# Patient Record
Sex: Female | Born: 1965 | Race: White | Hispanic: No | Marital: Married | Smoking: Never smoker
Health system: Southern US, Community
[De-identification: ages and names within clinical notes are randomized; demographics above are authoritative.]

## PROBLEM LIST (undated history)

## (undated) DIAGNOSIS — K760 Fatty (change of) liver, not elsewhere classified: Secondary | ICD-10-CM

## (undated) DIAGNOSIS — E162 Hypoglycemia, unspecified: Secondary | ICD-10-CM

## (undated) DIAGNOSIS — I10 Essential (primary) hypertension: Secondary | ICD-10-CM

## (undated) DIAGNOSIS — E119 Type 2 diabetes mellitus without complications: Secondary | ICD-10-CM

## (undated) DIAGNOSIS — Z9889 Other specified postprocedural states: Secondary | ICD-10-CM

## (undated) DIAGNOSIS — M722 Plantar fascial fibromatosis: Secondary | ICD-10-CM

## (undated) DIAGNOSIS — R112 Nausea with vomiting, unspecified: Secondary | ICD-10-CM

## (undated) DIAGNOSIS — E876 Hypokalemia: Secondary | ICD-10-CM

## (undated) DIAGNOSIS — F32A Depression, unspecified: Secondary | ICD-10-CM

## (undated) HISTORY — DX: Depression, unspecified: F32.A

## (undated) HISTORY — PX: ABDOMINAL HYSTERECTOMY: SHX81

## (undated) HISTORY — DX: Hypoglycemia, unspecified: E16.2

## (undated) HISTORY — DX: Hypokalemia: E87.6

## (undated) HISTORY — DX: Essential (primary) hypertension: I10

## (undated) HISTORY — PX: NO PAST SURGERIES: SHX2092

---

## 2010-04-05 HISTORY — PX: BREAST BIOPSY: SHX20

## 2011-07-14 LAB — CBC
HGB: 14.1 g/dL (ref 12.0–16.0)
MCH: 30.2 pg (ref 26.0–34.0)
MCV: 90 fL (ref 80–100)
Platelet: 343 10*3/uL (ref 150–440)
RBC: 4.65 10*6/uL (ref 3.80–5.20)
WBC: 10.7 10*3/uL (ref 3.6–11.0)

## 2011-07-14 LAB — CK TOTAL AND CKMB (NOT AT ARMC): CK, Total: 43 U/L (ref 21–215)

## 2011-07-14 LAB — HEPATIC FUNCTION PANEL A (ARMC)
Albumin: 4.2 g/dL (ref 3.4–5.0)
Bilirubin, Direct: <0.1 mg/dL (ref 0.00–0.20)
SGOT(AST): 56 U/L — ABNORMAL HIGH (ref 15–37)
Total Protein: 8.1 g/dL (ref 6.4–8.2)

## 2011-07-14 LAB — BASIC METABOLIC PANEL
Anion Gap: 13 (ref 7–16)
BUN: 13 mg/dL (ref 7–18)
Calcium, Total: 9.1 mg/dL (ref 8.5–10.1)
Chloride: 104 mmol/L (ref 98–107)
Creatinine: 0.65 mg/dL (ref 0.60–1.30)
Glucose: 106 mg/dL — ABNORMAL HIGH (ref 65–99)
Osmolality: 280 (ref 275–301)

## 2011-07-15 ENCOUNTER — Observation Stay: Payer: Self-pay | Admitting: Internal Medicine

## 2011-07-15 DIAGNOSIS — R079 Chest pain, unspecified: Secondary | ICD-10-CM

## 2011-07-15 LAB — POTASSIUM: Potassium: 3.2 mmol/L — ABNORMAL LOW (ref 3.5–5.1)

## 2011-07-15 LAB — HEPATIC FUNCTION PANEL A (ARMC)
Albumin: 3.9 g/dL (ref 3.4–5.0)
Alkaline Phosphatase: 79 U/L (ref 50–136)
Bilirubin, Direct: <0.1 mg/dL (ref 0.00–0.20)
Bilirubin,Total: 0.3 mg/dL (ref 0.2–1.0)
SGOT(AST): 51 U/L — ABNORMAL HIGH (ref 15–37)
SGPT (ALT): 90 U/L — ABNORMAL HIGH
Total Protein: 7.5 g/dL (ref 6.4–8.2)

## 2011-07-15 LAB — CK TOTAL AND CKMB (NOT AT ARMC): CK, Total: 32 U/L (ref 21–215)

## 2011-07-15 LAB — TROPONIN I
Troponin-I: <0.02 ng/mL
Troponin-I: <0.02 ng/mL

## 2011-07-21 ENCOUNTER — Encounter: Payer: Self-pay | Admitting: *Deleted

## 2011-07-23 ENCOUNTER — Encounter: Payer: Self-pay | Admitting: Cardiovascular Disease

## 2011-07-23 ENCOUNTER — Ambulatory Visit (INDEPENDENT_AMBULATORY_CARE_PROVIDER_SITE_OTHER): Payer: No Typology Code available for payment source | Admitting: Cardiovascular Disease

## 2011-07-23 DIAGNOSIS — R079 Chest pain, unspecified: Secondary | ICD-10-CM | POA: Insufficient documentation

## 2011-07-23 DIAGNOSIS — I10 Essential (primary) hypertension: Secondary | ICD-10-CM | POA: Insufficient documentation

## 2011-07-23 MED ORDER — LISINOPRIL 20 MG PO TABS: 20.0000 mg | ORAL_TABLET | Freq: Every day | ORAL | Status: DC

## 2011-07-23 NOTE — Patient Instructions (Signed)
Your physician recommends that you return for lab work in: TODAY BMET, LFT, MAGNESIUM, TSH  Your physician has requested that you have an echocardiogram DX CHEST PAIN 786.50. Echocardiography is a painless test that uses sound waves to create images of your heart. It provides your doctor with information about the size and shape of your heart and how well your heart's chambers and valves are working. This procedure takes approximately one hour. There are no restrictions for this procedure.   FOLLOW UP AS NEEDED

## 2011-07-23 NOTE — Assessment & Plan Note (Signed)
She was recently diagnosed with hypertension. She does have family history of hypertension. She was mildly hypokalemic on presentation. Her blood pressure has improved significantly after the addition of lisinopril 10 mg once daily but she still gets frequent feedings about 140. Thus, I will increase the dose to 20 mg once daily. Also given her recent tachycardia, hypertension and anxiety , I will check a TSH level. She was mildly hypokalemic of unclear etiology. I will repeat her CMP today. If hypokalemia persists, she might need to be evaluated for secondary hypertension. Her blood pressure seems to be much better controlled though on one medication now and the possibility of that is still not high. Patient needs to establish with a primary care physician. I suggested Dr. Dan Humphreys or Dr. Darrick Huntsman and provided her with contact information.

## 2011-07-23 NOTE — Assessment & Plan Note (Signed)
Her chest pain is atypical and does not seem to be cardiac.  It is likely musculoskeletal in nature. There seems to be a component of anxiety as well. These symptoms have improved since her hospital discharge. Due to her continued symptoms and recent diagnosis of hypertension, I will obtain an echocardiogram to evaluate LV systolic function and left ventricular hypertrophy.

## 2011-07-23 NOTE — Progress Notes (Signed)
HPI  This is a 46 year old female who is here today for a followup visit after recent hospitalization at [facility]. She presented there with atypical chest pain. She was found to be hypertensive on presentation with blood pressure 174/98. Her labs were remarkable for hypokalemia with a potassium of 3.3. Her liver enzymes were mildly elevated with an ALT of 96. She ruled out for myocardial infarction by cardiac enzymes. She was mildly tachycardic on presentation with a heart 109. She underwent a treadmill stress test which showed no evidence of ischemia but she did have hypertensive response to exercise. Her blood pressure actually went up to 230/148 with exercise. She was started on lisinopril 10 mg once daily. Overall, she has been feeling better. Her blood pressure has improved at home. However, she continues to have readings above 140 systolic. When her blood pressure is elevated, she complains of palpitations although the heart rate is not high at that time. She continues to have brief episodes of sharp chest discomfort lasting for a few seconds. This usually happens at rest and not with activities. She denies any shortness of breath even with intense physical activities. She seems to be anxious about her symptoms. She is not aware of any thyroid problems. She is a nonsmoker and drinks alcohol occasionally. She is a mother of 3 teenage boys and that creates stress. 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  No Known Allergies   Current Outpatient Prescriptions on File Prior to Visit  Medication Sig Dispense Refill  . omeprazole (PRILOSEC) 10 MG capsule Take 10 mg by mouth daily as needed.      Marland Kitchen DISCONTD: lisinopril (PRINIVIL,ZESTRIL) 10 MG tablet Take 10 mg by mouth daily.         Past Medical History  Diagnosis Date  . Chest pain   . Hypokalemia   . Hypoglycemia   . HTN (hypertension)      History reviewed. No pertinent past surgical history.   Family History    Problem Relation Age of Onset  . Heart disease Mother   . Coronary artery disease Mother 14  . Heart failure Mother     chf  . Diabetes Mother   . Heart disease Father     Defibrillator  . Diabetes Father   . Diabetes Sister      History   Social History  . Marital Status: Married    Spouse Name: N/A    Number of Children: 3  . Years of Education: N/A   Occupational History  . house wife   . 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 Smoking status: Never Smoker   . Smokeless tobacco: Never Used  . Alcohol Use: Yes     ocassionally  . Drug Use: No  . Sexually Active: Not on file   Other Topics Concern  . Not on file   Social History Narrative  . No narrative on file     ROS Constitutional: Negative for fever, chills, diaphoresis, activity change, appetite change and fatigue.  HENT: Negative for hearing loss, nosebleeds, congestion, sore throat, facial swelling, drooling, trouble swallowing, neck pain, voice change, sinus pressure and tinnitus.  Eyes: Negative for photophobia, pain, discharge and visual disturbance.  Respiratory: Negative for apnea, cough, shortness of breath and wheezing.  Cardiovascular: Negative for and leg swelling.  Gastrointestinal: Negative for nausea, vomiting, abdominal pain, diarrhea, constipation, blood in stool and abdominal distention.  Genitourinary: Negative for dysuria, urgency, frequency, hematuria and decreased urine volume.  Musculoskeletal: Negative for myalgias, back pain, joint swelling, arthralgias and gait problem.  Skin: Negative for color change, pallor, rash and wound.  Neurological: Negative for dizziness, tremors, seizures, syncope, speech difficulty, weakness, light-headedness, numbness and headaches.  Psychiatric/Behavioral: Negative for suicidal ideas, hallucinations, behavioral problems and agitation.    PHYSICAL EXAM   BP 134/84  Pulse 77  Ht 5\' 5"  (1.651 m)  Wt 189 lb 8 oz  (85.957 kg)  BMI 31.53 kg/m2 Constitutional: She is oriented to person, place, and time. She appears well-developed and well-nourished. No distress.  HENT: No nasal discharge.  Head: Normocephalic and atraumatic.  Eyes: Pupils are equal and round. Right eye exhibits no discharge. Left eye exhibits no discharge.  Neck: Normal range of motion. Neck supple. No JVD present. No thyromegaly present.  Cardiovascular: Normal rate, regular rhythm, normal heart sounds. Exam reveals no gallop and no friction rub. No murmur heard.  Pulmonary/Chest: Effort normal and breath sounds normal. No stridor. No respiratory distress. She has no wheezes. She has no rales. She exhibits no tenderness.  Abdominal: Soft. Bowel sounds are normal. She exhibits no distension. There is no tenderness. There is no rebound and no guarding.  Musculoskeletal: Normal range of motion. She exhibits no edema and no tenderness.  Neurological: She is alert and oriented to person, place, and time. Coordination normal.  Skin: Skin is warm and dry. No rash noted. She is not diaphoretic. No erythema. No pallor.  Psychiatric: She has a normal mood and affect. Her behavior is normal. Judgment and thought content normal.     EKG: normal sinus rhythm with no significant ST or T wave changes.    ASSESSMENT AND PLAN

## 2011-07-24 LAB — HEPATIC FUNCTION PANEL
ALT: 48 IU/L — ABNORMAL HIGH (ref 0–32)
AST: 26 IU/L (ref 0–40)
Alkaline Phosphatase: 83 IU/L (ref 25–150)
Bilirubin, Direct: 0.09 mg/dL (ref 0.00–0.40)
Total Protein: 7 g/dL (ref 6.0–8.5)

## 2011-07-24 LAB — BASIC METABOLIC PANEL
Chloride: 103 mmol/L (ref 97–108)
GFR calc Af Amer: 111 mL/min/{1.73_m2} (ref >59)
GFR calc non Af Amer: 96 mL/min/{1.73_m2} (ref >59)
Glucose: 92 mg/dL (ref 65–99)
Potassium: 4 mmol/L (ref 3.5–5.2)
Sodium: 139 mmol/L (ref 134–144)

## 2011-07-24 LAB — TSH: TSH: 1.98 u[IU]/mL (ref 0.450–4.500)

## 2011-07-24 LAB — MAGNESIUM: Magnesium: 2 mg/dL (ref 1.6–2.6)

## 2011-07-26 NOTE — Progress Notes (Signed)
Pt.notified

## 2011-07-28 ENCOUNTER — Other Ambulatory Visit (INDEPENDENT_AMBULATORY_CARE_PROVIDER_SITE_OTHER): Payer: No Typology Code available for payment source

## 2011-07-28 ENCOUNTER — Other Ambulatory Visit: Payer: Self-pay

## 2011-07-28 DIAGNOSIS — R079 Chest pain, unspecified: Secondary | ICD-10-CM

## 2011-07-28 DIAGNOSIS — I10 Essential (primary) hypertension: Secondary | ICD-10-CM

## 2011-07-28 DIAGNOSIS — R011 Cardiac murmur, unspecified: Secondary | ICD-10-CM

## 2011-08-31 ENCOUNTER — Other Ambulatory Visit: Payer: No Typology Code available for payment source

## 2011-10-05 ENCOUNTER — Ambulatory Visit: Payer: Self-pay

## 2011-11-01 ENCOUNTER — Ambulatory Visit: Payer: Self-pay

## 2012-12-01 ENCOUNTER — Ambulatory Visit: Payer: Self-pay

## 2013-07-29 IMAGING — CR DG CHEST 2V
1 series · 2 of 2 positions shown · non-contrast
Comparison: none

REASON FOR EXAM: chest pain
COMMENTS:   May transport without cardiac monitor

PROCEDURE:     DXR - DXR CHEST PA (OR AP) AND LATERAL  - July 14, 2011  [DATE]
RESULT:     The lung fields are clear. The heart, mediastinal and osseous
structures show no significant abnormalities.

[Series 1: w chest pa · 0.14mm/px · 2 of 2 slices shown]
[im 1/2]
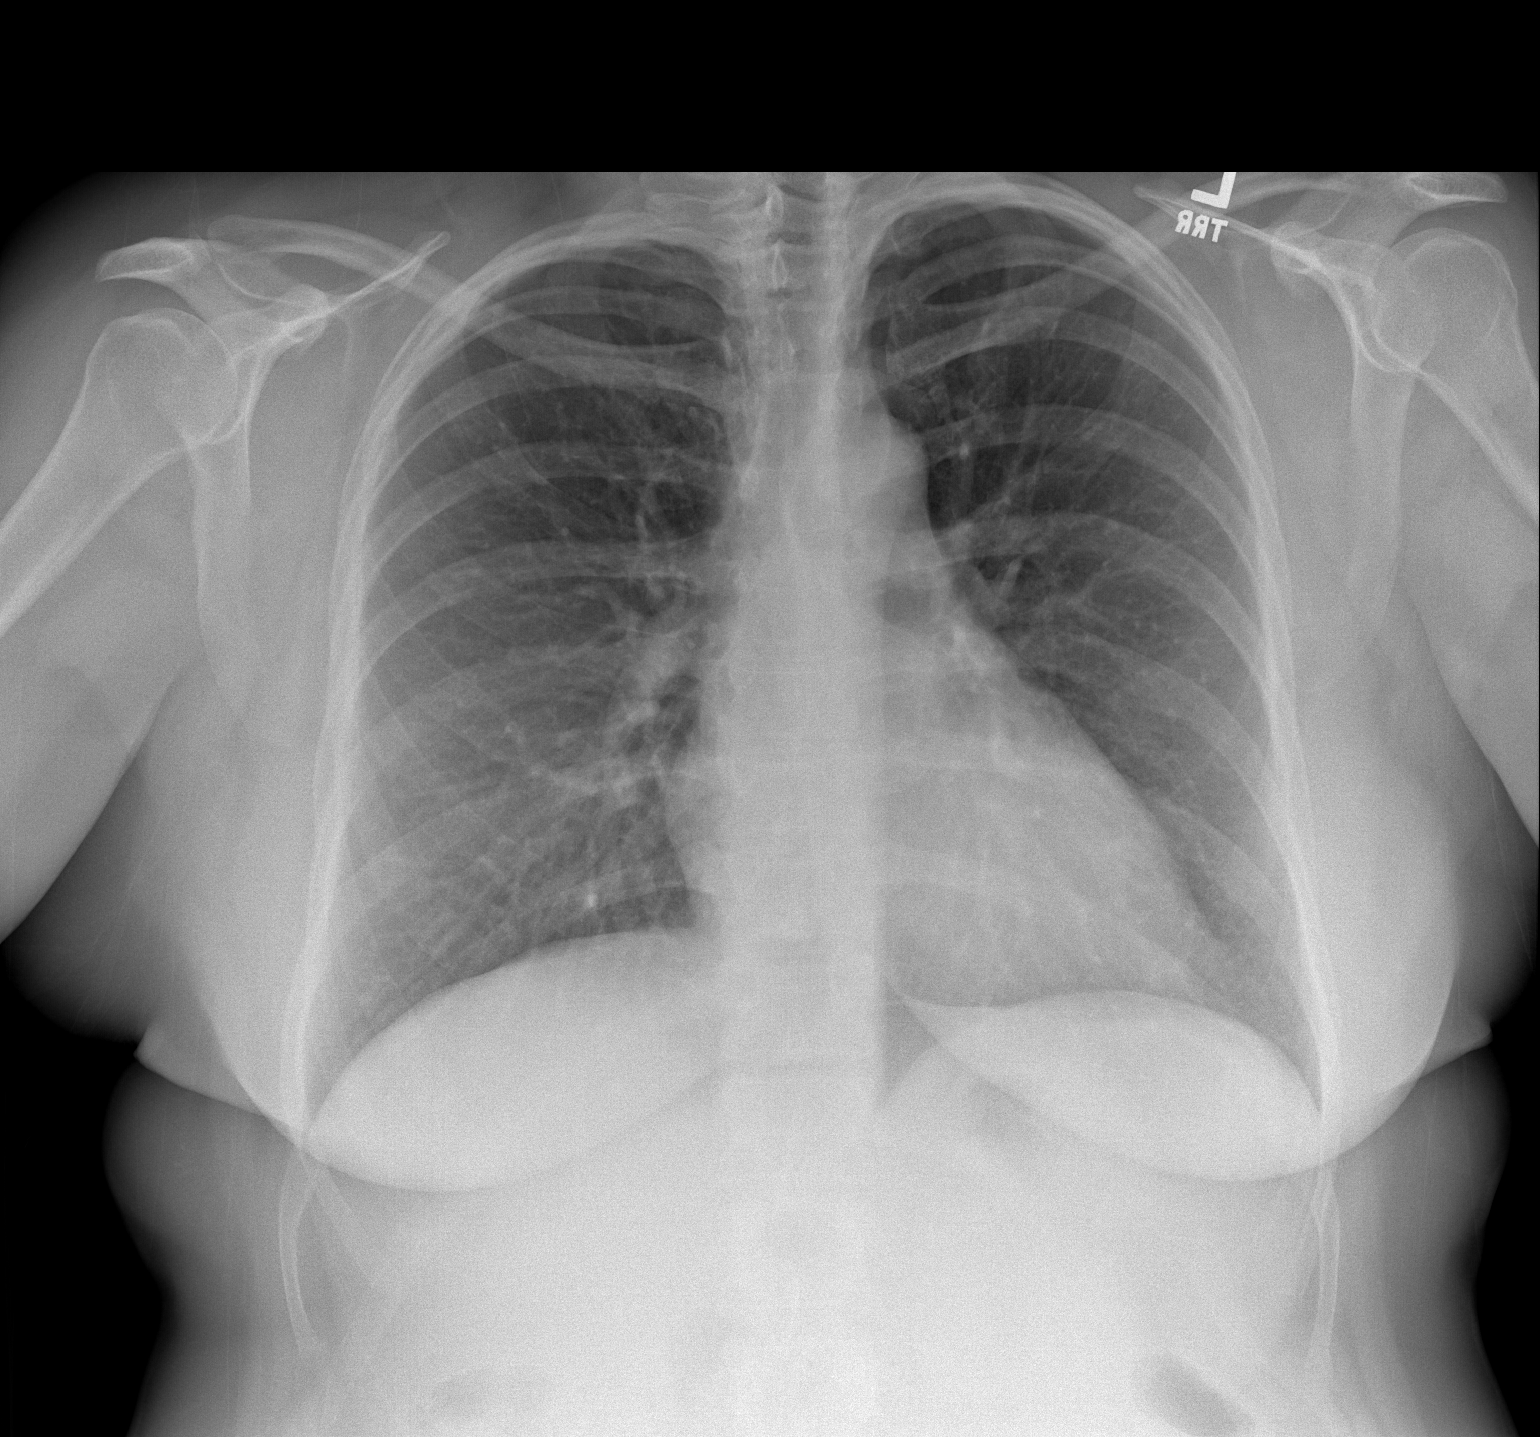
[im 2/2]
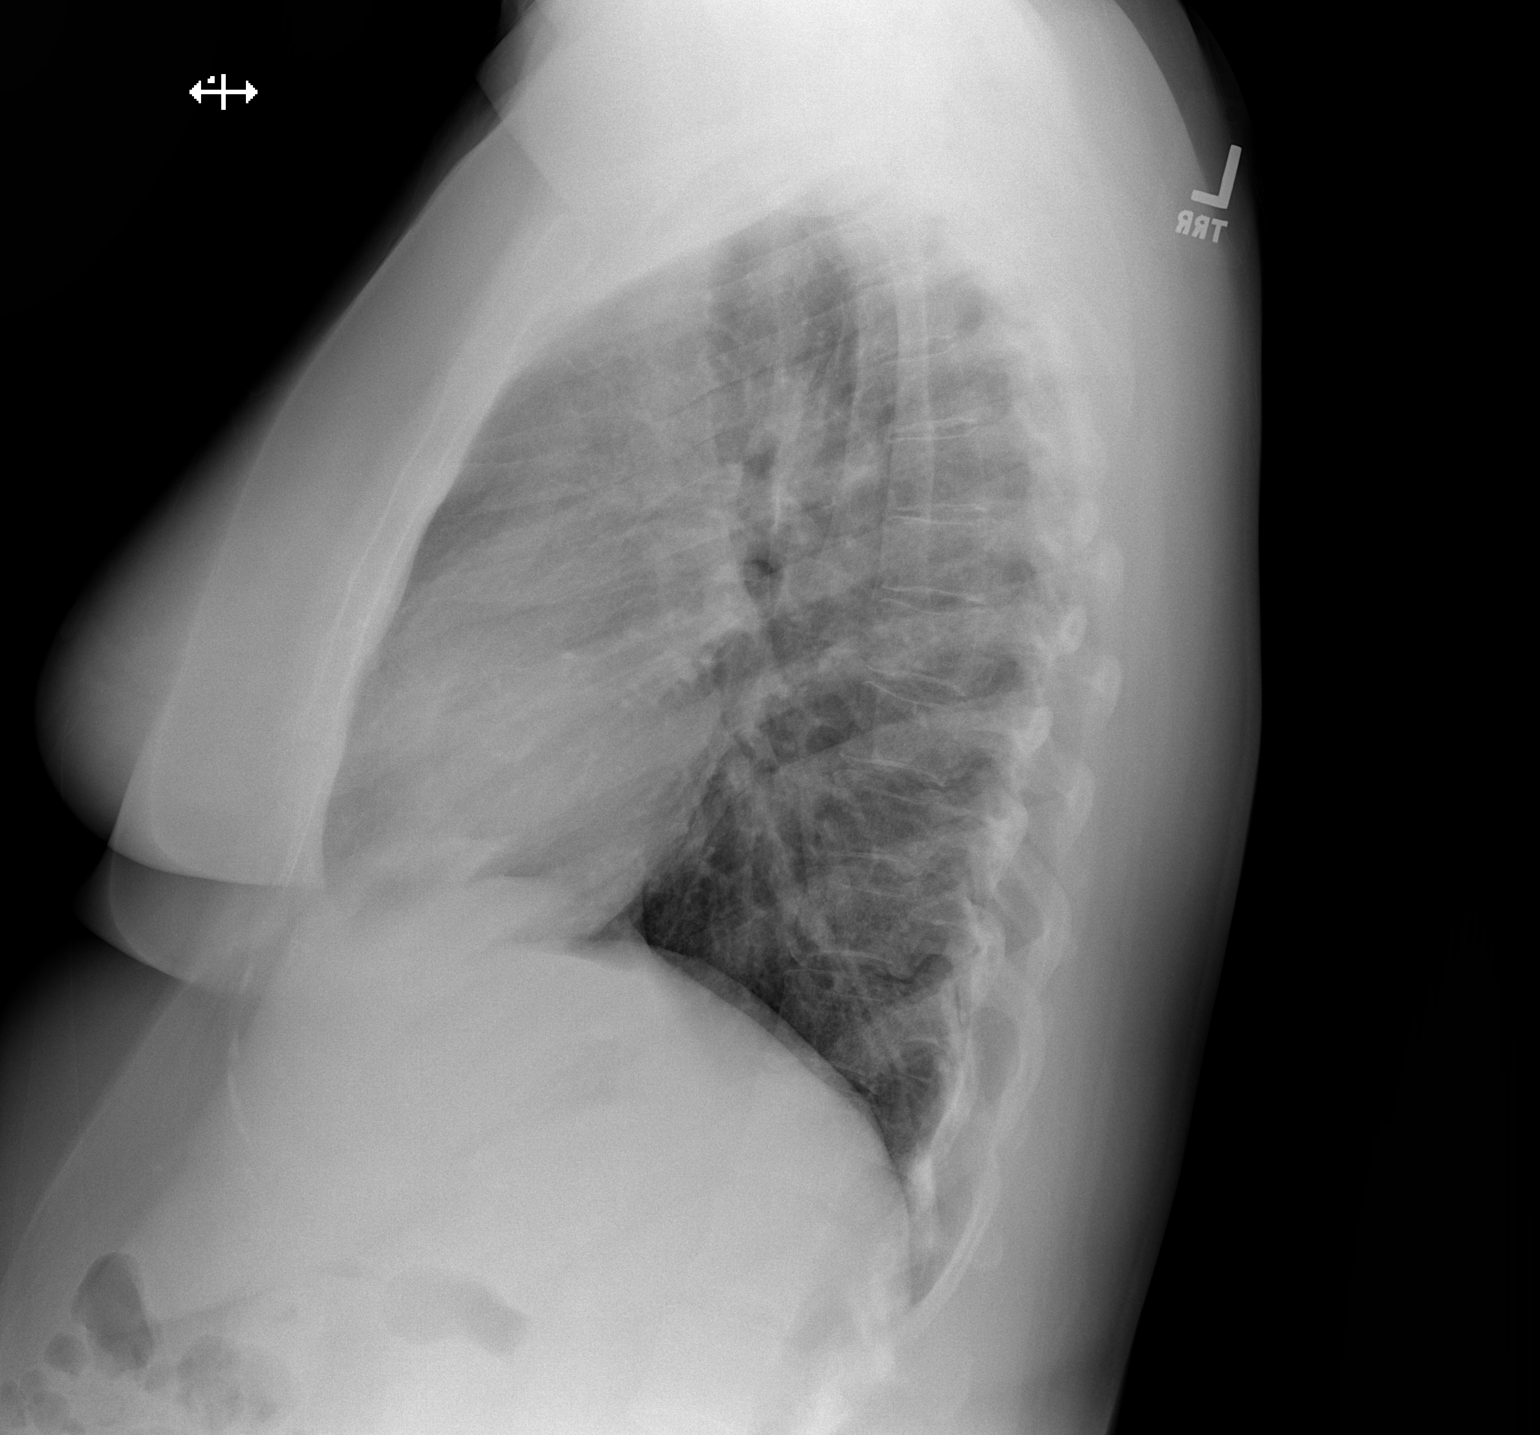

[2 of 2 positions shown; findings below may reference images not displayed]

IMPRESSION: 1.     No significant abnormalities are noted.

## 2013-12-03 ENCOUNTER — Ambulatory Visit: Payer: Self-pay

## 2014-07-28 NOTE — H&P (Signed)
PATIENT NAMEJEANET, [name] MR#:  [id number] DATE OF BIRTH:  Dec 05, 1965  DATE OF ADMISSION:  07/15/2011  PRIMARY CARE PHYSICIAN: None.  CHIEF COMPLAINT: Chest pain.   HISTORY OF PRESENT ILLNESS: [name] is a 49 year old female presented with left sided chest pain, dull in nature, severity is 5/10, radiating to left arm pit, duration is all day. No other associated symptoms like sweating or vomiting or shortness of breath.  PAST MEDICAL HISTORY: History of coronary artery disease.   PAST SURGICAL HISTORY: None.   ADMISSION MEDICATIONS: None.   ALLERGIES: No known drug allergies.  SOCIAL HABITS: Nonsmoker, never smoked. No history of alcohol or drug abuse.   FAMILY HISTORY: Both parents had heart disease, but at an old age, her father in his 61s and later in life he had a defibrillator. Her mother had coronary disease at the age of 32. She had congestive heart failure. She is also diabetic. Her father has diabetes mellitus and she has one sister who has diabetes as well.   SOCIAL HISTORY: The patient is married. She has three children. She lives with her husband. 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   REVIEW OF SYSTEMS: CONSTITUTIONAL: No fever. No chills. No night sweats. No fatigue. EYES: No blurring of vision. No double vision. ENT: No hearing impairment. No sore throat. No dysphagia. CARDIOVASCULAR: Reports left upper chest pain. No shortness of breath. No edema. No syncope. No palpitations. RESPIRATORY: No shortness of breath. No cough. No sputum production, but reports the chest pain as above. GASTROINTESTINAL: She has no abdominal pain, but reports some indigestion feeling. No vomiting. No diarrhea. GENITOURINARY: No dysuria. No frequency of urination. No vaginal bleed. MUSCULOSKELETAL: No joint swelling. No pain. No muscular pain or swelling. INTEGUMENTARY: No skin rash. No ulcers. NEUROLOGY: No focal weakness. No  seizure activity. No headache. PSYCHIATRY: No anxiety. No depression. ENDOCRINE: No heat or cold intolerance. No polyuria or polydipsia.   PHYSICAL EXAMINATION:   VITAL SIGNS: Blood pressure 174/98, pulse 80, respiratory rate 18, temperature 98.8, and oxygen saturation 96%.   GENERAL APPEARANCE: Young female lying in bed in no acute distress.   HEAD AND NECK EXAMINATION: No pallor. No icterus. No cyanosis.   EARS, NOSE, AND THROAT: Hearing was normal. Nasal mucosa, lips, and tongue were normal.   EYES: Normal eyelids and conjunctiva. Pupils are about 4 to 5 mm, equal and reactive to light.   NECK: Supple. Trachea at midline. No thyromegaly. No cervical lymphadenopathy. No masses.   HEART: Normal S1 and S2. No S3 or S4. No murmur. No gallop. No carotid bruits.   RESPIRATORY: Normal breathing pattern without use of accessory muscles. No rales. No wheezing.   ABDOMEN: Soft without tenderness. No hepatosplenomegaly. No masses. No hernias.   MUSCULOSKELETAL: No joint swelling. No clubbing.   SKIN: No ulcers. No subcutaneous nodules.   NEUROLOGIC: Cranial nerves II through XII are intact. No focal motor deficits.   PSYCHIATRY: The patient is alert and oriented x3. Mood and affect were normal.   LABS/STUDIES: Serum glucose 106, BUN 13, creatinine 0.65, sodium 140, and potassium was low at 3.3. Liver function tests were normal, but elevated transaminases;  AST 56 and ALT 96. CPK 43. Troponin less than 0.02. CBC showed white count 10,000, hemoglobin 14, hematocrit 41, and platelet count 343.   Her initial EKG showed sinus tachycardia at rate of 109 per minute.  Unremarkable EKG.  Followup EKG  showed sinus rhythm at rate of 70 per minute. No ischemic abnormalities.   ASSESSMENT:  1. Left-sided chest pain. 2. Hypertension. The patient is not known to be hypertensive before. 3. Mild hypokalemia.  4. Elevated liver transaminases.   PLAN: Admit the patient for observation. Follow-up on  cardiac enzymes. I placed her on metoprolol 25 mg twice a day. Aspirin 325 mg a day. Schedule her for treadmill stress test in the morning. I ordered viral hepatitis screen. I also ordered potassium supplementation to correct the hypokalemia.   TIME SPENT EVALUATING THIS PATIENT: More than 45 minutes.  ____________________________ Clovis Pu. Lenore Manner, MD amd:slb D: 07/15/2011 00:42:08 ET T: 07/15/2011 08:56:38 ET JOB#: [id number]  cc: Clovis Pu. Lenore Manner, MD, <Dictator> Mike Craze Irven Coe MD ELECTRONICALLY SIGNED 07/24/2011 0:31

## 2014-07-28 NOTE — Discharge Summary (Signed)
[name] NAMEDILCIA, Sandoval MR#:  [id number] DATE OF BIRTH:  1965/09/12  DATE OF ADMISSION:  07/15/2011 DATE OF DISCHARGE:  07/15/2011  ADMITTING DIAGNOSIS: Chest pain.   DISCHARGE DIAGNOSES:  1. Chest pain.  2. Hypertension.  3. Hypokalemia.  4. Hypoglycemia in nonfasting specimen.   5. Elevated transaminases of unclear etiology.   DISCHARGE CONDITION: Stable.   DISCHARGE MEDICATIONS: [name] is to start lisinopril 10 mg p.o. daily dose.   DIET: 2 gram salt.  ACTIVITY LIMITATIONS: As tolerated. Advance activity to 30 minute five times a week.   FOLLOW UP: Follow-up appointment with Dr. Clayborn Bigness to establish primary care physician and be seen in the next few days after discharge. Also Dr. Rockey Situ in one week after discharge.    CONSULTANTS: None.   HISTORY OF PRESENT ILLNESS: [name] is a 49 year old Caucasian female with no significant past medical history who presented to the hospital with complaints of chest pains. Please refer to Dr. Zacarias Pontes admission note on 07/15/2011. On arrival to Emergency Room [name]'s blood pressure was 174/98, pulse 80, respiration rate 18, temperature 98.8 and oxygen saturation was 96%. Physical examination was unremarkable.   LABORATORY, DIAGNOSTIC AND RADIOLOGICAL DATA: Chest PA and lateral 07/14/2011 showed no significant abnormalities found.   [name]'s EKG showed sinus tachycardia at 109 beats per minute, no acute ST-T changes, however, were noted. [name]'s lab data revealed low potassium of 3.3, glucose 106, otherwise BMP was unremarkable. [name]'s cardiac enzymes x2 were normal. [name]'s CBC was within normal limits. [name]'s chest x-ray was unremarkable as mentioned above.   HOSPITAL COURSE: She was admitted to the hospital for further evaluation. She was started on beta blockers, aspirin, nitroglycerin and underwent treadmill stress test in the morning on 07/15/2011. She did well during cardiac stress test procedure as read by Dr. Rockey Situ  and her EKG was normal. However, [name] was noted to have significantly elevated blood pressure at rest as well as severe hypertension with exertion. Her blood pressure was initially 158/110 and increased even higher to 231/148 on exertion. She had no symptoms and her EKG was within normal limits. It was felt by Dr. Rockey Situ that [name]'s stress test is of low probability for coronary artery disease, however, he was concerned about [name]'s blood pressure and recommended to start [name] on blood pressure medications. As was discussed with [name], [name] will be started on lisinopril at 10 mg p.o. daily dose. On day of discharge [name]'s vital signs are stable. Temperature 97.7, pulse 82, respiration rate 20, blood pressure 138/64, saturation 95% on room air at rest. It is recommended to follow [name]'s blood pressure readings very closely and advance [name]'s blood pressure medications as soon as needed. [name] was also advised to change her lifestyle. She was advised to cut down on salt intake as well as try to exercise and she was in agreement with that.   In regards to hyperglycemia, which was noted on her basic metabolic panel, [name]'s glucose level was found to be mildly elevated at 106. [name] was advised to follow up with her primary care physician and get fasting blood glucose level checked or formal glucose tolerance test if needed.   [name] was noted also to be hypokalemic. Potassium level 3.3 on admission, 07/14/2011. Potassium level 3.2 on 07/15/2011. Low potassium level was of unclear significance or etiology, however, [name] was advised to have her blood potassium level rechecked as outpatient. Now since she has been initiated on lisinopril potassium level probably will normalize.   [name] was  noted to have elevated transaminases on arrival to the hospital. [name]'s AST as well as ALT were elevated to 56 and 96 respectively. Repeated lab studies showed slightly elevated AST as well  as ALT in approximately same range, 50s and 90s respectively. [name] was noted to have stable abnormal transaminases elevated of unclear etiology. She was advised to follow up with primary care physician and make decisions about evaluation investigation of her transaminase elevation. She was asymptomatic. Her abdomen was soft and bowel sounds are present. No pain was elicited on exam.   [name] is being discharged in stable condition with above-mentioned medications and follow up.    TIME SPENT: 40 minutes.   ____________________________ Theodoro Grist, MD rv:cms D: 07/15/2011 18:20:55 ET T: 07/18/2011 14:32:59 ET JOB#: [id number]  cc: Theodoro Grist, MD, <Dictator> Lavera Guise, MD [place] MD ELECTRONICALLY SIGNED 07/27/2011 14:70

## 2017-07-18 ENCOUNTER — Other Ambulatory Visit: Payer: Self-pay

## 2017-07-18 ENCOUNTER — Ambulatory Visit: Payer: No Typology Code available for payment source | Admitting: Nurse Practitioner

## 2017-07-18 ENCOUNTER — Encounter: Payer: Self-pay | Admitting: Nurse Practitioner

## 2017-07-18 VITALS — BP 168/99 | HR 74 | Resp 16 | Ht 65.0 in | Wt 204.0 lb

## 2017-07-18 DIAGNOSIS — I10 Essential (primary) hypertension: Secondary | ICD-10-CM | POA: Diagnosis not present

## 2017-07-18 DIAGNOSIS — R635 Abnormal weight gain: Secondary | ICD-10-CM

## 2017-07-18 DIAGNOSIS — R300 Dysuria: Secondary | ICD-10-CM | POA: Diagnosis not present

## 2017-07-18 DIAGNOSIS — F320 Major depressive disorder, single episode, mild: Secondary | ICD-10-CM | POA: Diagnosis not present

## 2017-07-18 LAB — POCT URINALYSIS DIPSTICK
Bilirubin, UA: NEGATIVE
Glucose, UA: NEGATIVE
KETONES UA: NEGATIVE
LEUKOCYTES UA: NEGATIVE
NITRITE UA: NEGATIVE
PH UA: 7.5 (ref 5.0–8.0)
Spec Grav, UA: 1.01 (ref 1.010–1.025)
UROBILINOGEN UA: 0.2 U/dL

## 2017-07-18 MED ORDER — ESCITALOPRAM OXALATE 5 MG PO TABS: 5.0000 mg | ORAL_TABLET | Freq: Every day | ORAL | 2 refills | Status: DC

## 2017-07-18 MED ORDER — LISINOPRIL 20 MG PO TABS: 20.0000 mg | ORAL_TABLET | Freq: Every day | ORAL | 2 refills | Status: DC

## 2017-07-18 NOTE — Progress Notes (Signed)
[facility] [place], [place] [address]  Internal MEDICINE  Office Visit Note  Patient Name: [name]  [id number]  [id number]  Date of Service: 08/07/2017     Pt is here for routine follow up.   Chief Complaint  Patient presents with  . Hypertension  . Allergic Rhinitis     The patient is here for follow up visit. Has not been here in few years. Is off blood pressure medication. bp is moderately elevated today.  She is due for routine blood work and wellness visit.  Recent treatment for UTI. Finished BID cipro last week on Thursday.  Hypertension  This is a chronic problem. The current episode started more than 1 year ago. The problem has been waxing and waning since onset. The problem is uncontrolled. Pertinent negatives include no chest pain, headaches, neck pain, palpitations or shortness of breath. There are no associated agents to hypertension. Risk factors for coronary artery disease include dyslipidemia and obesity. Past treatments include ACE inhibitors. The current treatment provides moderate improvement. There are no compliance problems.       Current Medication: Outpatient Encounter Medications as of 07/18/2017  Medication Sig  . escitalopram (LEXAPRO) 5 MG tablet Take 1 tablet (5 mg total) by mouth daily. Take 1 to 2 tablets po QPM (Patient taking differently: Take 1 to 2 tablets po QPM prn)  . lisinopril (PRINIVIL,ZESTRIL) 20 MG tablet Take 1 tablet (20 mg total) by mouth daily.  Marland Kitchen omeprazole (PRILOSEC) 10 MG capsule Take 10 mg by mouth daily as needed.  . [DISCONTINUED] lisinopril (PRINIVIL,ZESTRIL) 20 MG tablet Take 1 tablet (20 mg total) by mouth daily. (Patient not taking: Reported on 07/18/2017)   No facility-administered encounter medications on file as of 07/18/2017.     Surgical History: Past Surgical History:  Procedure Laterality Date  . NO PAST SURGERIES      Medical History: Past Medical History:  Diagnosis Date  . Chest  pain   . HTN (hypertension)   . Hypoglycemia   . Hypokalemia     Family History: Family History  Problem Relation Age of Onset  . Heart disease Mother   . Coronary artery disease Mother 77  . Heart failure Mother        chf  . Diabetes Mother   . Heart disease Father        Defibrillator  . Diabetes Father   . Diabetes Sister     Social History   Socioeconomic History  . Marital status: Married    Spouse name: Not on file  . Number of children: 3  . Years of education: Not on file  . Highest education level: Not on file  Occupational History  . 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 Financial resource strain: Not on file  . Food insecurity:    Worry: Not on file    Inability: Not on file  . Transportation needs:    Medical: Not on file    Non-medical: Not on file  Tobacco Use  . Smoking status: Never Smoker  . Smokeless tobacco: Never Used  Substance and Sexual Activity  . Alcohol use: Yes    Comment: ocassionally  . Drug use: No  . Sexual activity: Not on file  Lifestyle  . Physical activity:    Days per week: Not on file    Minutes per session: Not on file  .  Stress: Not on file  Relationships  . Social connections:    Talks on phone: Not on file    Gets together: Not on file    Attends religious service: Not on file    Active member of club or organization: Not on file    Attends meetings of clubs or organizations: Not on file    Relationship status: Not on file  . Intimate partner violence:    Fear of current or ex partner: Not on file    Emotionally abused: Not on file    Physically abused: Not on file    Forced sexual activity: Not on file  Other Topics Concern  . Not on file  Social History Narrative  . Not on file      Review of Systems  Constitutional: Positive for unexpected weight change. Negative for activity change, chills and fatigue.  HENT: Negative for  congestion, postnasal drip, rhinorrhea, sneezing, sore throat and voice change.   Eyes: Negative.  Negative for redness.  Respiratory: Negative for cough, chest tightness, shortness of breath and wheezing.   Cardiovascular: Negative for chest pain and palpitations.       Blood pressure elevated today.   Gastrointestinal: Negative for abdominal pain, constipation, diarrhea, nausea and vomiting.  Genitourinary: Negative for dysuria and frequency.  Musculoskeletal: Negative for arthralgias, back pain, joint swelling and neck pain.  Skin: Negative for rash.  Allergic/Immunologic: Positive for environmental allergies.  Neurological: Negative for dizziness, tremors, numbness and headaches.  Hematological: Negative for adenopathy. Does not bruise/bleed easily.  Psychiatric/Behavioral: Negative for behavioral problems (Depression), sleep disturbance and suicidal ideas. The patient is not nervous/anxious.     Today's Vitals   07/18/17 1210  BP: (!) 168/99  Pulse: 74  Resp: 16  SpO2: 94%  Weight: 204 lb (92.5 kg)  Height: 5\' 5"  (1.651 m)    Physical Exam  Constitutional: She is oriented to person, place, and time. She appears well-developed and well-nourished. No distress.  HENT:  Head: Normocephalic and atraumatic.  Nose: Nose normal.  Mouth/Throat: Oropharynx is clear and moist. No oropharyngeal exudate.  Eyes: Pupils are equal, round, and reactive to light. Conjunctivae and EOM are normal.  Neck: Normal range of motion. Neck supple. No JVD present. No tracheal deviation present. No thyromegaly present.  Cardiovascular: Normal rate, regular rhythm and normal heart sounds. Exam reveals no gallop and no friction rub.  No murmur heard. Pulmonary/Chest: Effort normal and breath sounds normal. No respiratory distress. She has no wheezes. She has no rales. She exhibits no tenderness.  Abdominal: Soft. Bowel sounds are normal. There is no tenderness.  Musculoskeletal: Normal range of motion.   Lymphadenopathy:    She has no cervical adenopathy.  Neurological: She is alert and oriented to person, place, and time. No cranial nerve deficit.  Skin: Skin is warm and dry. She is not diaphoretic.  Psychiatric: She has a normal mood and affect. Her behavior is normal. Judgment and thought content normal.  Nursing note and vitals reviewed.   Assessment/Plan: 1. Essential hypertension Restart lisinopril 20mg  daily. Check routine, fasting labs.  - lisinopril (PRINIVIL,ZESTRIL) 20 MG tablet; Take 1 tablet (20 mg total) by mouth daily.  Dispense: 30 tablet; Refill: 2  2. Depression, major, single episode, mild (HCC) Start lexapro 5mg  daily.  - escitalopram (LEXAPRO) 5 MG tablet; Take 1 tablet (5 mg total) by mouth daily. Take 1 to 2 tablets po QPM (Patient taking differently: Take 1 to 2 tablets po QPM prn)  Dispense: 45  tablet; Refill: 2  3. Abnormal weight gain Check routine, fasting labs. Will consider starting on appetite suppressant at next visit if blood pressure improves.   4. Dysuria - POCT urinalysis dipstick showing only trace protein. Will monitor.   General Counseling: Selinda verbalizes understanding of the findings of todays visit and agrees with plan of treatment. I have discussed any further diagnostic evaluation that may be needed or ordered today. We also reviewed her medications today. she has been encouraged to call the office with any questions or concerns that should arise related to todays visit.   Hypertension Counseling:   The following hypertensive lifestyle modification were recommended and discussed:  1. Limiting alcohol intake to less than 1 oz/day of ethanol:(24 oz of beer or 8 oz of wine or 2 oz of 100-proof whiskey). 2. Take baby ASA 81 mg daily. 3. Importance of regular aerobic exercise and losing weight. 4. Reduce dietary saturated fat and cholesterol intake for overall cardiovascular health. 5. Maintaining adequate dietary potassium, calcium, and  magnesium intake. 6. Regular monitoring of the blood pressure. 7. Reduce sodium intake to less than 100 mmol/day (less than 2.3 gm of sodium or less than 6 gm of sodium choride)   This patient was seen by Leretha Pol, FNP- C in Collaboration with Dr Lavera Guise as a part of collaborative care agreement    Orders Placed This Encounter  Procedures  . POCT urinalysis dipstick    Meds ordered this encounter  Medications  . lisinopril (PRINIVIL,ZESTRIL) 20 MG tablet    Sig: Take 1 tablet (20 mg total) by mouth daily.    Dispense:  30 tablet    Refill:  2    Order Specific Question:   Supervising Provider    Answer:   Lavera Guise [[id number]]  . escitalopram (LEXAPRO) 5 MG tablet    Sig: Take 1 tablet (5 mg total) by mouth daily. Take 1 to 2 tablets po QPM    Dispense:  45 tablet    Refill:  2    Order Specific Question:   Supervising Provider    Answer:   Lavera Guise [[id number]]    Time spent: 79 Minutes      Dr Lavera Guise Internal medicine

## 2017-07-20 ENCOUNTER — Other Ambulatory Visit: Payer: Self-pay | Admitting: Nurse Practitioner

## 2017-07-21 LAB — CBC
HEMATOCRIT: 40.5 % (ref 34.0–46.6)
HEMOGLOBIN: 13.6 g/dL (ref 11.1–15.9)
MCH: 30.6 pg (ref 26.6–33.0)
MCHC: 33.6 g/dL (ref 31.5–35.7)
MCV: 91 fL (ref 79–97)
Platelets: 173 10*3/uL (ref 150–379)
RBC: 4.44 x10E6/uL (ref 3.77–5.28)
RDW: 13.5 % (ref 12.3–15.4)
WBC: 5.6 10*3/uL (ref 3.4–10.8)

## 2017-07-21 LAB — T4, FREE: FREE T4: 1.13 ng/dL (ref 0.82–1.77)

## 2017-07-21 LAB — LIPID PANEL W/O CHOL/HDL RATIO
Cholesterol, Total: 188 mg/dL (ref 100–199)
HDL: 42 mg/dL (ref >39)
LDL CALC: 115 mg/dL — AB (ref 0–99)
Triglycerides: 155 mg/dL — ABNORMAL HIGH (ref 0–149)
VLDL CHOLESTEROL CAL: 31 mg/dL (ref 5–40)

## 2017-07-21 LAB — COMPREHENSIVE METABOLIC PANEL
A/G RATIO: 1.5 (ref 1.2–2.2)
ALT: 72 IU/L — ABNORMAL HIGH (ref 0–32)
AST: 43 IU/L — ABNORMAL HIGH (ref 0–40)
Albumin: 4 g/dL (ref 3.5–5.5)
Alkaline Phosphatase: 82 IU/L (ref 39–117)
BUN/Creatinine Ratio: 13 (ref 9–23)
BUN: 9 mg/dL (ref 6–24)
Bilirubin Total: 0.3 mg/dL (ref 0.0–1.2)
CALCIUM: 8.4 mg/dL — AB (ref 8.7–10.2)
CO2: 22 mmol/L (ref 20–29)
CREATININE: 0.69 mg/dL (ref 0.57–1.00)
Chloride: 103 mmol/L (ref 96–106)
GFR, EST AFRICAN AMERICAN: 116 mL/min/{1.73_m2} (ref >59)
GFR, EST NON AFRICAN AMERICAN: 100 mL/min/{1.73_m2} (ref >59)
Globulin, Total: 2.7 g/dL (ref 1.5–4.5)
Glucose: 100 mg/dL — ABNORMAL HIGH (ref 65–99)
POTASSIUM: 3.7 mmol/L (ref 3.5–5.2)
Sodium: 142 mmol/L (ref 134–144)
TOTAL PROTEIN: 6.7 g/dL (ref 6.0–8.5)

## 2017-07-21 LAB — TSH: TSH: 2.12 u[IU]/mL (ref 0.450–4.500)

## 2017-07-21 LAB — HGB A1C W/O EAG: HEMOGLOBIN A1C: 5.7 % — AB (ref 4.8–5.6)

## 2017-07-25 NOTE — Progress Notes (Signed)
Please let the patient know that her liver functions were a bit elevated. I would like to send her for additional lab work and get her set up for abdominal ultrasound for further evaluation. Lab slip is on my desk which may be mailed. Thanks

## 2017-07-26 ENCOUNTER — Telehealth: Payer: Self-pay

## 2017-07-26 NOTE — Telephone Encounter (Signed)
Mailed labslip  For additional lab

## 2017-07-26 NOTE — Telephone Encounter (Signed)
Pt advised for labs showed liver enzymes elevated we lik=e to additional labs and also U/s for abdominal as per pt she want hold for U/S for now she like to repeat lab and also mailed her labslip

## 2017-07-26 NOTE — Telephone Encounter (Signed)
-----   Message from Ronnell Freshwater, NP sent at 07/25/2017  8:48 AM EDT ----- Please let the patient know that her liver functions were a bit elevated. I would like to send her for additional lab work and get her set up for abdominal ultrasound for further evaluation. Lab slip is on my desk which may be mailed. Thanks

## 2017-08-01 ENCOUNTER — Other Ambulatory Visit: Payer: Self-pay | Admitting: Nurse Practitioner

## 2017-08-02 LAB — HEPATITIS PANEL, ACUTE
Hep A IgM: NEGATIVE
Hep B C IgM: NEGATIVE
Hep C Virus Ab: <0.1 s/co ratio (ref 0.0–0.9)
Hepatitis B Surface Ag: NEGATIVE

## 2017-08-02 LAB — HEPATIC FUNCTION PANEL
ALK PHOS: 83 IU/L (ref 39–117)
ALT: 59 IU/L — ABNORMAL HIGH (ref 0–32)
AST: 34 IU/L (ref 0–40)
Albumin: 3.8 g/dL (ref 3.5–5.5)
BILIRUBIN TOTAL: 0.3 mg/dL (ref 0.0–1.2)
BILIRUBIN, DIRECT: 0.09 mg/dL (ref 0.00–0.40)
Total Protein: 6.3 g/dL (ref 6.0–8.5)

## 2017-08-07 DIAGNOSIS — R635 Abnormal weight gain: Secondary | ICD-10-CM | POA: Insufficient documentation

## 2017-08-07 DIAGNOSIS — F320 Major depressive disorder, single episode, mild: Secondary | ICD-10-CM | POA: Insufficient documentation

## 2017-08-07 DIAGNOSIS — R300 Dysuria: Secondary | ICD-10-CM | POA: Insufficient documentation

## 2017-09-02 ENCOUNTER — Encounter: Payer: Self-pay | Admitting: Nurse Practitioner

## 2017-09-02 ENCOUNTER — Ambulatory Visit: Payer: No Typology Code available for payment source | Admitting: Nurse Practitioner

## 2017-09-02 VITALS — BP 129/80 | HR 84 | Resp 16 | Ht 65.0 in | Wt 198.0 lb

## 2017-09-02 DIAGNOSIS — F320 Major depressive disorder, single episode, mild: Secondary | ICD-10-CM | POA: Diagnosis not present

## 2017-09-02 DIAGNOSIS — I10 Essential (primary) hypertension: Secondary | ICD-10-CM

## 2017-09-02 DIAGNOSIS — Z0001 Encounter for general adult medical examination with abnormal findings: Secondary | ICD-10-CM

## 2017-09-02 DIAGNOSIS — R300 Dysuria: Secondary | ICD-10-CM

## 2017-09-02 DIAGNOSIS — Z124 Encounter for screening for malignant neoplasm of cervix: Secondary | ICD-10-CM | POA: Diagnosis not present

## 2017-09-02 NOTE — Progress Notes (Signed)
[facility] [place], [place] [address]  Internal MEDICINE  Office Visit Note  Patient Name: [name]  [id number]  [id number]  Date of Service: 09/21/2017   Pt is here for routine health maintenance examination  Chief Complaint  Patient presents with  . Hypertension  . Depression     Hypertension  This is a chronic problem. The current episode started more than 1 year ago. The problem is unchanged. The problem is controlled. Associated symptoms include anxiety and palpitations. Pertinent negatives include no chest pain, headaches, neck pain or shortness of breath. There are no associated agents to hypertension. Risk factors for coronary artery disease include dyslipidemia and post-menopausal state. Past treatments include ACE inhibitors. The current treatment provides moderate improvement. There are no compliance problems.   Depression         This is a recurrent problem.  The current episode started more than 1 month ago.   The onset quality is gradual.   The problem occurs intermittently.  The problem has been gradually improving since onset.  Associated symptoms include no fatigue, no headaches and no suicidal ideas.     The symptoms are aggravated by family issues.  Past treatments include SSRIs - Selective serotonin reuptake inhibitors.  Compliance with treatment is good.  Previous treatment provided moderate relief.  Risk factors include family history.   Past medical history includes anxiety.      Current Medication: Outpatient Encounter Medications as of 09/02/2017  Medication Sig  . lisinopril (PRINIVIL,ZESTRIL) 20 MG tablet Take 1 tablet (20 mg total) by mouth daily.  Marland Kitchen omeprazole (PRILOSEC) 10 MG capsule Take 10 mg by mouth daily as needed.  . [DISCONTINUED] escitalopram (LEXAPRO) 5 MG tablet Take 1 tablet (5 mg total) by mouth daily. Take 1 to 2 tablets po QPM (Patient taking differently: Take 1 to 2 tablets po QPM prn)   No  facility-administered encounter medications on file as of 09/02/2017.     Surgical History: Past Surgical History:  Procedure Laterality Date  . NO PAST SURGERIES      Medical History: Past Medical History:  Diagnosis Date  . Chest pain   . HTN (hypertension)   . Hypoglycemia   . Hypokalemia     Family History: Family History  Problem Relation Age of Onset  . Heart disease Mother   . Coronary artery disease Mother 21  . Heart failure Mother        chf  . Diabetes Mother   . Heart disease Father        Defibrillator  . Diabetes Father   . Diabetes Sister       Review of Systems  Constitutional: Negative for activity change, chills, fatigue and unexpected weight change.  HENT: Negative for congestion, postnasal drip, rhinorrhea, sneezing, sore throat and voice change.   Eyes: Negative.  Negative for redness.  Respiratory: Negative for cough, chest tightness, shortness of breath and wheezing.   Cardiovascular: Positive for palpitations. Negative for chest pain.       Blood pressure improved from last visit.   Gastrointestinal: Negative for abdominal pain, constipation, diarrhea, nausea and vomiting.  Endocrine: Negative for cold intolerance, heat intolerance, polydipsia, polyphagia and polyuria.  Genitourinary: Negative for dysuria and frequency.  Musculoskeletal: Negative for arthralgias, back pain, joint swelling and neck pain.  Skin: Negative for rash.  Allergic/Immunologic: Positive for environmental allergies.  Neurological: Negative for dizziness, tremors, numbness and headaches.  Hematological: Negative for adenopathy. Does not bruise/bleed easily.  Psychiatric/Behavioral: Positive for depression. Negative for behavioral problems (Depression), sleep disturbance and suicidal ideas. The patient is not nervous/anxious.     Today's Vitals   09/02/17 1414  BP: 129/80  Pulse: 84  Resp: 16  SpO2: 94%  Weight: 198 lb (89.8 kg)  Height: 5\' 5"  (1.651 m)     Physical Exam  Constitutional: She is oriented to person, place, and time. She appears well-developed and well-nourished. No distress.  HENT:  Head: Normocephalic and atraumatic.  Nose: Nose normal.  Mouth/Throat: Oropharynx is clear and moist. No oropharyngeal exudate.  Eyes: Pupils are equal, round, and reactive to light. Conjunctivae and EOM are normal.  Neck: Normal range of motion. Neck supple. No JVD present. No tracheal deviation present. No thyromegaly present.  Cardiovascular: Normal rate, regular rhythm, normal heart sounds and intact distal pulses. Exam reveals no gallop and no friction rub.  No murmur heard. Pulmonary/Chest: Effort normal and breath sounds normal. No respiratory distress. She has no wheezes. She has no rales. She exhibits no tenderness. Right breast exhibits no inverted nipple, no mass, no nipple discharge, no skin change and no tenderness. Left breast exhibits no inverted nipple, no mass, no nipple discharge, no skin change and no tenderness.  Abdominal: Soft. Bowel sounds are normal. There is no tenderness.  Genitourinary: Vagina normal and uterus normal.  Genitourinary Comments: No tenderness, masses, or organomegaly present during bimanual exam.   Musculoskeletal: Normal range of motion.  Lymphadenopathy:    She has no cervical adenopathy.  Neurological: She is alert and oriented to person, place, and time. No cranial nerve deficit.  Skin: Skin is warm and dry. She is not diaphoretic.  Psychiatric: She has a normal mood and affect. Her behavior is normal. Judgment and thought content normal.  Nursing note and vitals reviewed.    LABS: Recent Results (from the past 2160 hour(s))  POCT urinalysis dipstick     Status: None   Collection Time: 07/18/17 12:27 PM  Result Value Ref Range   Color, UA     Clarity, UA     Glucose, UA negative    Bilirubin, UA negative    Ketones, UA negative    Spec Grav, UA 1.010 1.010 - 1.025   Blood, UA trace    pH,  UA 7.5 5.0 - 8.0   Protein, UA trace    Urobilinogen, UA 0.2 0.2 or 1.0 E.U./dL   Nitrite, UA negative    Leukocytes, UA Negative Negative   Appearance     Odor    Comprehensive metabolic panel     Status: Abnormal   Collection Time: 07/20/17  9:42 AM  Result Value Ref Range   Glucose 100 (H) 65 - 99 mg/dL   BUN 9 6 - 24 mg/dL   Creatinine, Ser 0.69 0.57 - 1.00 mg/dL   GFR calc non Af Amer 100 >59 mL/min/1.73   GFR calc Af Amer 116 >59 mL/min/1.73   BUN/Creatinine Ratio 13 9 - 23   Sodium 142 134 - 144 mmol/L   Potassium 3.7 3.5 - 5.2 mmol/L   Chloride 103 96 - 106 mmol/L   CO2 22 20 - 29 mmol/L   Calcium 8.4 (L) 8.7 - 10.2 mg/dL   Total Protein 6.7 6.0 - 8.5 g/dL   Albumin 4.0 3.5 - 5.5 g/dL   Globulin, Total 2.7 1.5 - 4.5 g/dL   Albumin/Globulin Ratio 1.5 1.2 - 2.2   Bilirubin Total 0.3 0.0 - 1.2 mg/dL   Alkaline Phosphatase 82 39 -  117 IU/L   AST 43 (H) 0 - 40 IU/L   ALT 72 (H) 0 - 32 IU/L  CBC     Status: None   Collection Time: 07/20/17  9:42 AM  Result Value Ref Range   WBC 5.6 3.4 - 10.8 x10E3/uL   RBC 4.44 3.77 - 5.28 x10E6/uL   Hemoglobin 13.6 11.1 - 15.9 g/dL   Hematocrit 40.5 34.0 - 46.6 %   MCV 91 79 - 97 fL   MCH 30.6 26.6 - 33.0 pg   MCHC 33.6 31.5 - 35.7 g/dL   RDW 13.5 12.3 - 15.4 %   Platelets 173 150 - 379 x10E3/uL  Lipid Panel w/o Chol/HDL Ratio     Status: Abnormal   Collection Time: 07/20/17  9:42 AM  Result Value Ref Range   Cholesterol, Total 188 100 - 199 mg/dL   Triglycerides 155 (H) 0 - 149 mg/dL   HDL 42 >39 mg/dL   VLDL Cholesterol Cal 31 5 - 40 mg/dL   LDL Calculated 115 (H) 0 - 99 mg/dL  Hgb A1c w/o eAG     Status: Abnormal   Collection Time: 07/20/17  9:42 AM  Result Value Ref Range   Hgb A1c MFr Bld 5.7 (H) 4.8 - 5.6 %    Comment:          Prediabetes: 5.7 - 6.4          Diabetes: >6.4          Glycemic control for adults with diabetes: <7.0   T4, free     Status: None   Collection Time: 07/20/17  9:42 AM  Result Value Ref  Range   Free T4 1.13 0.82 - 1.77 ng/dL  TSH     Status: None   Collection Time: 07/20/17  9:42 AM  Result Value Ref Range   TSH 2.120 0.450 - 4.500 uIU/mL  Hepatic function panel     Status: Abnormal   Collection Time: 08/01/17  9:27 AM  Result Value Ref Range   Total Protein 6.3 6.0 - 8.5 g/dL   Albumin 3.8 3.5 - 5.5 g/dL   Bilirubin Total 0.3 0.0 - 1.2 mg/dL   Bilirubin, Direct 0.09 0.00 - 0.40 mg/dL   Alkaline Phosphatase 83 39 - 117 IU/L   AST 34 0 - 40 IU/L   ALT 59 (H) 0 - 32 IU/L  Hepatitis panel, acute     Status: None   Collection Time: 08/01/17  9:27 AM  Result Value Ref Range   Hep A IgM Negative Negative   Hepatitis B Surface Ag Negative Negative   Hep B C IgM Negative Negative   Hep C Virus Ab <0.1 0.0 - 0.9 s/co ratio    Comment:                                   Negative:     < 0.8                              Indeterminate: 0.8 - 0.9                                   Positive:     > 0.9  The CDC recommends that a positive HCV antibody result  be followed up with  a HCV Nucleic Acid Amplification  test (809983).   Pap IG and HPV (high risk) DNA detection     Status: None   Collection Time: 09/02/17  3:54 PM  Result Value Ref Range   DIAGNOSIS: Comment     Comment: UNSATISFACTORY FOR EVALUATION.   Specimen adequacy: Comment     Comment: Specimen processed and examined, but unsatisfactory for evaluation of epithelial abnormality because of insufficient squamous cellularity.    Clinician Provided ICD10 Comment     Comment: Z12.4   Performed by: Comment     Comment: Vernell Barrier, Cytotechnologist (ASCP)   QC reviewed by: Comment     Comment: Neomia Dear, Supervisory Cytotechnologist (ASCP)   PAP Smear Comment .    Note: Comment     Comment: The Pap smear is a screening test designed to aid in the detection of premalignant and malignant conditions of the uterine cervix.  It is not a diagnostic procedure and should not be used as the sole means of  detecting cervical cancer.  Both false-positive and false-negative reports do occur.    Test Methodology CANCELED     Comment: The Thin Prep(R) Imager was unable to read this specimen.  Therefore a manual review was performed.  Result canceled by the ancillary.    HPV, high-risk Negative Negative    Comment: This high-risk HPV test detects thirteen high-risk types (16/18/31/33/35/39/45/51/52/56/58/59/68) without differentiation.   Urinalysis, Routine w reflex microscopic     Status: None   Collection Time: 09/02/17  3:55 PM  Result Value Ref Range   Specific Gravity, UA 1.005 1.005 - 1.030   pH, UA 7.0 5.0 - 7.5   Color, UA Yellow Yellow   Appearance Ur Clear Clear   Leukocytes, UA Negative Negative   Protein, UA Negative Negative/Trace   Glucose, UA Negative Negative   Ketones, UA Negative Negative   RBC, UA Negative Negative   Bilirubin, UA Negative Negative   Urobilinogen, Ur 0.2 0.2 - 1.0 mg/dL   Nitrite, UA Negative Negative   Microscopic Examination Comment     Comment: Microscopic not indicated and not performed.   Assessment/Plan: 1. Encounter for general adult medical examination with abnormal findings Annual wellness visit today.   2. Essential hypertension Improved. Continue lisinopril 20mg  daily.  3. Depression, major, single episode, mild (HCC) Improved. Continue lexapro 5mg  daily.   4. Routine cervical smear - Pap IG and HPV (high risk) DNA detection  5. Dysuria - Urinalysis, Routine w reflex microscopic   General Counseling: Liley verbalizes understanding of the findings of todays visit and agrees with plan of treatment. I have discussed any further diagnostic evaluation that may be needed or ordered today. We also reviewed her medications today. she has been encouraged to call the office with any questions or concerns that should arise related to todays visit.    Counseling:  This patient was seen by Leretha Pol, FNP- C in Collaboration with Dr  Lavera Guise as a part of collaborative care agreement  Orders Placed This Encounter  Procedures  . Urinalysis, Routine w reflex microscopic      Time spent: [place], MD  Internal Medicine

## 2017-09-03 LAB — URINALYSIS, ROUTINE W REFLEX MICROSCOPIC
Bilirubin, UA: NEGATIVE
Glucose, UA: NEGATIVE
Ketones, UA: NEGATIVE
LEUKOCYTES UA: NEGATIVE
NITRITE UA: NEGATIVE
PH UA: 7 (ref 5.0–7.5)
Protein, UA: NEGATIVE
RBC, UA: NEGATIVE
Specific Gravity, UA: 1.005 (ref 1.005–1.030)
Urobilinogen, Ur: 0.2 mg/dL (ref 0.2–1.0)

## 2017-09-08 ENCOUNTER — Telehealth: Payer: Self-pay | Admitting: Nurse Practitioner

## 2017-09-08 LAB — PAP IG AND HPV HIGH-RISK
HPV, HIGH-RISK: NEGATIVE
PAP SMEAR COMMENT: 0

## 2017-09-08 NOTE — Telephone Encounter (Signed)
Informed the pt of her test results.

## 2017-09-08 NOTE — Telephone Encounter (Signed)
-----   Message from Ronnell Freshwater, NP sent at 09/08/2017 12:01 PM EDT ----- Can you let the patient know that her pap smear was normal. thanks

## 2017-09-12 ENCOUNTER — Other Ambulatory Visit: Payer: Self-pay

## 2017-09-12 DIAGNOSIS — F320 Major depressive disorder, single episode, mild: Secondary | ICD-10-CM

## 2017-09-12 MED ORDER — ESCITALOPRAM OXALATE 5 MG PO TABS: 5.0000 mg | ORAL_TABLET | Freq: Every day | ORAL | 2 refills | Status: DC

## 2017-09-21 DIAGNOSIS — Z1239 Encounter for other screening for malignant neoplasm of breast: Secondary | ICD-10-CM | POA: Insufficient documentation

## 2017-09-22 ENCOUNTER — Other Ambulatory Visit: Payer: Self-pay

## 2017-09-22 DIAGNOSIS — I10 Essential (primary) hypertension: Secondary | ICD-10-CM

## 2017-09-22 MED ORDER — LISINOPRIL 20 MG PO TABS: 20.0000 mg | ORAL_TABLET | Freq: Every day | ORAL | 3 refills | Status: DC

## 2018-01-17 ENCOUNTER — Ambulatory Visit: Payer: No Typology Code available for payment source | Admitting: Nurse Practitioner

## 2018-01-17 ENCOUNTER — Encounter: Payer: Self-pay | Admitting: Nurse Practitioner

## 2018-01-17 VITALS — BP 160/93 | HR 94 | Resp 16 | Ht 65.0 in | Wt 187.4 lb

## 2018-01-17 DIAGNOSIS — I10 Essential (primary) hypertension: Secondary | ICD-10-CM | POA: Diagnosis not present

## 2018-01-17 DIAGNOSIS — F320 Major depressive disorder, single episode, mild: Secondary | ICD-10-CM | POA: Diagnosis not present

## 2018-01-17 DIAGNOSIS — L209 Atopic dermatitis, unspecified: Secondary | ICD-10-CM

## 2018-01-17 MED ORDER — CLOTRIMAZOLE-BETAMETHASONE 1-0.05 % EX CREA: 1.0000 "application " | TOPICAL_CREAM | Freq: Two times a day (BID) | CUTANEOUS | 1 refills | Status: DC

## 2018-01-17 MED ORDER — LOSARTAN POTASSIUM 50 MG PO TABS: 50.0000 mg | ORAL_TABLET | Freq: Every day | ORAL | 3 refills | Status: DC

## 2018-01-17 NOTE — Progress Notes (Signed)
[facility] [place], [place] [address]  Internal MEDICINE  Office Visit Note  Patient Name: [name]  [id number]  [id number]  Date of Service: 01/17/2018   Pt is here for a sick visit.  Chief Complaint  Patient presents with  . Rash    rash on heel of feet and toes for 3wks     The patient did see dermatologist with psoriasis on her hands. She did mention to the dermatologist about the rash on her feet. Was told it was related to her blood pressure medication.   Rash  This is a new problem. The current episode started more than 1 month ago. The problem has been gradually worsening since onset. The affected locations include the right foot and left foot. The rash is characterized by itchiness, dryness, peeling and redness. She was exposed to nothing. Pertinent negatives include no congestion, cough, diarrhea, fatigue, rhinorrhea, shortness of breath, sore throat or vomiting. Past treatments include antihistamine and topical steroids. The treatment provided no relief. (Psoriasis)        Current Medication:  Outpatient Encounter Medications as of 01/17/2018  Medication Sig  . omeprazole (PRILOSEC) 10 MG capsule Take 10 mg by mouth daily as needed.  . [DISCONTINUED] lisinopril (PRINIVIL,ZESTRIL) 20 MG tablet Take 1 tablet (20 mg total) by mouth daily.  . clotrimazole-betamethasone (LOTRISONE) cream Apply 1 application topically 2 (two) times daily.  Marland Kitchen escitalopram (LEXAPRO) 5 MG tablet Take 1 tablet (5 mg total) by mouth daily. Take 1 to 2 tablets po QPM (Patient not taking: Reported on 01/17/2018)  . losartan (COZAAR) 50 MG tablet Take 1 tablet (50 mg total) by mouth daily.   No facility-administered encounter medications on file as of 01/17/2018.       Medical History: Past Medical History:  Diagnosis Date  . Chest pain   . HTN (hypertension)   . Hypoglycemia   . Hypokalemia      Today's Vitals   01/17/18 0908  BP: (!) 160/93   Pulse: 94  Resp: 16  SpO2: 98%  Weight: 187 lb 6.4 oz (85 kg)  Height: 5\' 5"  (1.651 m)    Review of Systems  Constitutional: Negative for chills, fatigue and unexpected weight change.       Has had weight loss of 11 pounds since her last visit.   HENT: Negative for congestion, postnasal drip, rhinorrhea, sneezing and sore throat.   Eyes: Negative.  Negative for redness.  Respiratory: Negative for cough, chest tightness, shortness of breath and wheezing.   Cardiovascular: Negative for chest pain and palpitations.       Blood pressure elevated today  Gastrointestinal: Negative for abdominal pain, constipation, diarrhea, nausea and vomiting.  Endocrine: Negative for cold intolerance, heat intolerance, polydipsia, polyphagia and polyuria.  Genitourinary: Negative for dysuria and frequency.  Musculoskeletal: Negative for arthralgias, back pain, joint swelling and neck pain.  Skin: Positive for rash.       Rash started on the tops of the toes. Has gradually spread up her feet and is not on the heels of both feet as well.   Allergic/Immunologic: Negative for environmental allergies.  Neurological: Negative.  Negative for tremors and numbness.  Hematological: Negative for adenopathy. Does not bruise/bleed easily.  Psychiatric/Behavioral: Negative for behavioral problems (Depression), sleep disturbance and suicidal ideas. The patient is not nervous/anxious.     Physical Exam  Constitutional: She is oriented to person, place, and time. She appears well-developed and well-nourished.  HENT:  Head: Normocephalic.  Nose: Nose normal.  Eyes: Pupils are equal, round, and reactive to light. EOM are normal.  Neck: Normal range of motion. Neck supple. No thyromegaly present.  Cardiovascular: Normal rate, regular rhythm and normal heart sounds.  Pulmonary/Chest: Effort normal and breath sounds normal.  Abdominal: Soft. There is no tenderness.  Musculoskeletal: Normal range of motion.   Lymphadenopathy:    She has no cervical adenopathy.  Neurological: She is alert and oriented to person, place, and time.  Skin: Skin is warm and dry.  There is fine, red, raised rash on the tops of both feet, spreading around plantar surface of the feet and onto the heels. Rash is rough in texture and a little itchy.   Psychiatric: She has a normal mood and affect. Her behavior is normal. Judgment and thought content normal.  Nursing note and vitals reviewed.  Assessment/Plan: 1. Atopic dermatitis, unspecified type Unclear etiology. Will treat with lotrisone twice a day.  - clotrimazole-betamethasone (LOTRISONE) cream; Apply 1 application topically 2 (two) times daily.  Dispense: 90 g; Refill: 1  2. Essential hypertension Dermatology suspects lisinopril may be causing rash on her feet. D/c lisinopril. Add losartan 50mg  daily. Patient to monitor at home. Will reassess at next visit.  - losartan (COZAAR) 50 MG tablet; Take 1 tablet (50 mg total) by mouth daily.  Dispense: 90 tablet; Refill: 3  3. Depression, major, single episode, mild ([place]) Patient mentions she is no longer taking lexapro due to negative side effects. Will reassess at next visit.   General Counseling: Hanley verbalizes understanding of the findings of todays visit and agrees with plan of treatment. I have discussed any further diagnostic evaluation that may be needed or ordered today. We also reviewed her medications today. she has been encouraged to call the office with any questions or concerns that should arise related to todays visit.    Counseling:  Hypertension Counseling:   The following hypertensive lifestyle modification were recommended and discussed:  1. Limiting alcohol intake to less than 1 oz/day of ethanol:(24 oz of beer or 8 oz of wine or 2 oz of 100-proof whiskey). 2. Take baby ASA 81 mg daily. 3. Importance of regular aerobic exercise and losing weight. 4. Reduce dietary saturated fat and cholesterol  intake for overall cardiovascular health. 5. Maintaining adequate dietary potassium, calcium, and magnesium intake. 6. Regular monitoring of the blood pressure. 7. Reduce sodium intake to less than 100 mmol/day (less than 2.3 gm of sodium or less than 6 gm of sodium choride)   This patient was seen by [place] with Dr Lavera Guise as a part of collaborative care agreement  Meds ordered this encounter  Medications  . clotrimazole-betamethasone (LOTRISONE) cream    Sig: Apply 1 application topically 2 (two) times daily.    Dispense:  90 g    Refill:  1    Patient has dermatitis on bilateral feet and hands.    Order Specific Question:   Supervising Provider    Answer:   Lavera Guise [[id number]]  . losartan (COZAAR) 50 MG tablet    Sig: Take 1 tablet (50 mg total) by mouth daily.    Dispense:  90 tablet    Refill:  3    Please d/c lisinopril    Order Specific Question:   Supervising Provider    Answer:   Lavera Guise [[id number]]    Time spent: 25 Minutes

## 2018-02-22 ENCOUNTER — Other Ambulatory Visit: Payer: Self-pay

## 2018-02-22 DIAGNOSIS — F320 Major depressive disorder, single episode, mild: Secondary | ICD-10-CM

## 2018-02-22 MED ORDER — ESCITALOPRAM OXALATE 5 MG PO TABS: 5.0000 mg | ORAL_TABLET | Freq: Every day | ORAL | 2 refills | Status: DC

## 2018-03-07 ENCOUNTER — Ambulatory Visit: Payer: No Typology Code available for payment source | Admitting: Nurse Practitioner

## 2018-03-07 ENCOUNTER — Encounter: Payer: Self-pay | Admitting: Nurse Practitioner

## 2018-03-07 VITALS — BP 154/88 | HR 75 | Resp 16 | Ht 65.0 in | Wt 194.0 lb

## 2018-03-07 DIAGNOSIS — F320 Major depressive disorder, single episode, mild: Secondary | ICD-10-CM | POA: Diagnosis not present

## 2018-03-07 DIAGNOSIS — I10 Essential (primary) hypertension: Secondary | ICD-10-CM | POA: Diagnosis not present

## 2018-03-07 DIAGNOSIS — Z1239 Encounter for other screening for malignant neoplasm of breast: Secondary | ICD-10-CM

## 2018-03-07 MED ORDER — LOSARTAN POTASSIUM 50 MG PO TABS: 75.0000 mg | ORAL_TABLET | Freq: Every day | ORAL | 3 refills | Status: DC

## 2018-03-07 MED ORDER — ESCITALOPRAM OXALATE 5 MG PO TABS: ORAL_TABLET | ORAL | 2 refills | Status: DC

## 2018-03-07 NOTE — Progress Notes (Signed)
[facility] [place], [place] [address]  Internal MEDICINE  Office Visit Note  Patient Name: [name]  [id number]  [id number]  Date of Service: 03/07/2018  Chief Complaint  Patient presents with  . Medical Management of Chronic Issues    6 month follow up, blood pressure medication was changed last visit  . Hypertension  . Quality Metric Gaps    pt does not get the flu vaccine    The patient is here for routine follow up visit. Blood pressure is slightly elevated. Did switch her from lisinopril to losartan at her last visit. Takes this at night, as it makes her a little sleepy. Denies chest pain or pressure. No dizziness or headaches.  Has restarted lexapro 5mg  daily. Has stopped taking it for a little while, as it caused negative sexual side effects. Started again when her family began renovations on their home. Taking the lexapro helps keep her more focused and less anxious about all of the stuff going on at home.  She is overdue for screening mammogram. She would like to hold off on getting colonoscopy or other colon cancer screening for now. She states there is no colon cancer in her family and she is having no problems with her bowels. She does not wish to get a flu shot.       Current Medication: Outpatient Encounter Medications as of 03/07/2018  Medication Sig  . clotrimazole-betamethasone (LOTRISONE) cream Apply 1 application topically 2 (two) times daily.  Marland Kitchen escitalopram (LEXAPRO) 5 MG tablet Take 1 to 2 tablets po QPM  . losartan (COZAAR) 50 MG tablet Take 1.5 tablets (75 mg total) by mouth daily.  Marland Kitchen omeprazole (PRILOSEC) 10 MG capsule Take 10 mg by mouth daily as needed.  . [DISCONTINUED] escitalopram (LEXAPRO) 5 MG tablet Take 1 tablet (5 mg total) by mouth daily. Take 1 to 2 tablets po QPM  . [DISCONTINUED] losartan (COZAAR) 50 MG tablet Take 1 tablet (50 mg total) by mouth daily.   No facility-administered encounter medications on file as  of 03/07/2018.     Surgical History: Past Surgical History:  Procedure Laterality Date  . NO PAST SURGERIES      Medical History: Past Medical History:  Diagnosis Date  . Chest pain   . HTN (hypertension)   . Hypoglycemia   . Hypokalemia     Family History: Family History  Problem Relation Age of Onset  . Heart disease Mother   . Coronary artery disease Mother 30  . Heart failure Mother        chf  . Diabetes Mother   . Heart disease Father        Defibrillator  . Diabetes Father   . Diabetes Sister     Social History   Socioeconomic History  . Marital status: Married    Spouse name: Not on file  . Number of children: 3  . Years of education: Not on file  . Highest education level: Not on file  Occupational History  . 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 Financial resource strain: Not on file  . Food insecurity:    Worry: Not on file    Inability: Not on file  . Transportation needs:    Medical: Not on file    Non-medical: Not on file  Tobacco Use  . Smoking status: Never Smoker  . Smokeless tobacco: Never Used  Substance and Sexual Activity  . Alcohol use: Not Currently    Comment: ocassionally  . Drug use: No  . Sexual activity: Not on file  Lifestyle  . Physical activity:    Days per week: Not on file    Minutes per session: Not on file  . Stress: Not on file  Relationships  . Social connections:    Talks on phone: Not on file    Gets together: Not on file    Attends religious service: Not on file    Active member of club or organization: Not on file    Attends meetings of clubs or organizations: Not on file    Relationship status: Not on file  . Intimate partner violence:    Fear of current or ex partner: Not on file    Emotionally abused: Not on file    Physically abused: Not on file    Forced sexual activity: Not on file  Other Topics Concern  . Not on file  Social  History Narrative  . Not on file      Review of Systems  Constitutional: Negative for chills, fatigue and unexpected weight change.       Has had weight loss of 4 pounds since her last visit.   HENT: Negative for congestion, postnasal drip, rhinorrhea, sneezing and sore throat.   Eyes: Negative.   Respiratory: Negative for cough, chest tightness, shortness of breath and wheezing.   Cardiovascular: Negative for chest pain and palpitations.       Blood pressure elevated today  Gastrointestinal: Negative for abdominal pain, constipation, diarrhea, nausea and vomiting.  Endocrine: Negative for cold intolerance, heat intolerance, polydipsia, polyphagia and polyuria.  Genitourinary: Negative for dysuria and frequency.  Musculoskeletal: Negative for arthralgias, back pain, joint swelling and neck pain.  Skin: Positive for rash.       Rash on her feet has resolved. Still gets eczematous flares on the hands, especially during the winter months.   Allergic/Immunologic: Negative for environmental allergies.  Neurological: Negative for dizziness, tremors, numbness and headaches.  Hematological: Negative for adenopathy. Does not bruise/bleed easily.  Psychiatric/Behavioral: Positive for dysphoric mood. Negative for behavioral problems (Depression), sleep disturbance and suicidal ideas. The patient is not nervous/anxious.     Today's Vitals   03/07/18 0934  BP: (!) 154/88  Pulse: 75  Resp: 16  SpO2: 94%  Weight: 194 lb (88 kg)  Height: 5\' 5"  (1.651 m)    Physical Exam  Constitutional: She is oriented to person, place, and time. She appears well-developed and well-nourished.  HENT:  Head: Normocephalic and atraumatic.  Eyes: Pupils are equal, round, and reactive to light. EOM are normal.  Neck: Normal range of motion. Neck supple. No JVD present. No tracheal deviation present. No thyromegaly present.  Cardiovascular: Normal rate, regular rhythm and normal heart sounds.  Pulmonary/Chest:  Effort normal and breath sounds normal.  Abdominal: Soft. Bowel sounds are normal. There is no tenderness.  Musculoskeletal: Normal range of motion.  Lymphadenopathy:    She has no cervical adenopathy.  Neurological: She is alert and oriented to person, place, and time.  Skin: Skin is warm and dry.  Psychiatric: She has a normal mood and affect. Her behavior is normal. Judgment and thought content normal.  Nursing note and vitals reviewed.  Assessment/Plan: 1. Essential hypertension Increase losartan to 75mg  daily. Advised her to monitor closely at home and notify me if blood pressure continues to be elevated.  - losartan (COZAAR) 50 MG tablet; Take  1.5 tablets (75 mg total) by mouth daily.  Dispense: 45 tablet; Refill: 3  2. Depression, major, single episode, mild (HCC) Continue with lexapro 5mg  tablets daily. May increase to two tablets daily as indicated.  - escitalopram (LEXAPRO) 5 MG tablet; Take 1 to 2 tablets po QPM  Dispense: 45 tablet; Refill: 2  3. Screening for breast cancer - MM DIGITAL SCREENING BILATERAL; Future  General Counseling: Jameria verbalizes understanding of the findings of todays visit and agrees with plan of treatment. I have discussed any further diagnostic evaluation that may be needed or ordered today. We also reviewed her medications today. she has been encouraged to call the office with any questions or concerns that should arise related to todays visit.  Hypertension Counseling:   The following hypertensive lifestyle modification were recommended and discussed:  1. Limiting alcohol intake to less than 1 oz/day of ethanol:(24 oz of beer or 8 oz of wine or 2 oz of 100-proof whiskey). 2. Take baby ASA 81 mg daily. 3. Importance of regular aerobic exercise and losing weight. 4. Reduce dietary saturated fat and cholesterol intake for overall cardiovascular health. 5. Maintaining adequate dietary potassium, calcium, and magnesium intake. 6. Regular monitoring of  the blood pressure. 7. Reduce sodium intake to less than 100 mmol/day (less than 2.3 gm of sodium or less than 6 gm of sodium choride)   This patient was seen by [place] with Dr Lavera Guise as a part of collaborative care agreement  Orders Placed This Encounter  Procedures  . MM DIGITAL SCREENING BILATERAL    Meds ordered this encounter  Medications  . escitalopram (LEXAPRO) 5 MG tablet    Sig: Take 1 to 2 tablets po QPM    Dispense:  45 tablet    Refill:  2    Order Specific Question:   Supervising Provider    Answer:   Lavera Guise [[id number]]  . losartan (COZAAR) 50 MG tablet    Sig: Take 1.5 tablets (75 mg total) by mouth daily.    Dispense:  45 tablet    Refill:  3    Please note increase in dosing.    Order Specific Question:   Supervising Provider    Answer:   Lavera Guise [[id number]]    Time spent: 74 Minutes      Dr Lavera Guise Internal medicine

## 2018-08-30 ENCOUNTER — Other Ambulatory Visit: Payer: Self-pay

## 2018-08-30 DIAGNOSIS — I10 Essential (primary) hypertension: Secondary | ICD-10-CM

## 2018-08-30 MED ORDER — LOSARTAN POTASSIUM 50 MG PO TABS: 75.0000 mg | ORAL_TABLET | Freq: Every day | ORAL | 1 refills | Status: DC

## 2018-09-07 ENCOUNTER — Encounter: Payer: Self-pay | Admitting: Nurse Practitioner

## 2018-10-03 ENCOUNTER — Ambulatory Visit: Payer: No Typology Code available for payment source | Admitting: Nurse Practitioner

## 2018-10-03 ENCOUNTER — Other Ambulatory Visit: Payer: Self-pay

## 2018-10-03 ENCOUNTER — Encounter: Payer: Self-pay | Admitting: Nurse Practitioner

## 2018-10-03 VITALS — BP 138/82 | Ht 65.0 in | Wt 198.0 lb

## 2018-10-03 DIAGNOSIS — F320 Major depressive disorder, single episode, mild: Secondary | ICD-10-CM | POA: Diagnosis not present

## 2018-10-03 DIAGNOSIS — I10 Essential (primary) hypertension: Secondary | ICD-10-CM | POA: Diagnosis not present

## 2018-10-03 MED ORDER — LOSARTAN POTASSIUM 50 MG PO TABS: 75.0000 mg | ORAL_TABLET | Freq: Every day | ORAL | 3 refills | Status: DC

## 2018-10-03 MED ORDER — ESCITALOPRAM OXALATE 5 MG PO TABS: ORAL_TABLET | ORAL | 3 refills | Status: DC

## 2018-10-03 NOTE — Progress Notes (Signed)
[facility] [place], [place] [address]  Internal MEDICINE  Telephone Visit  Patient Name: [name]  [id number]  [id number]  Date of Service: 10/03/2018  I connected with the patient at 3:44pm by webcam and verified the patients identity using two identifiers.   I discussed the limitations, risks, security and privacy concerns of performing an evaluation and management service by webcam and the availability of in person appointments. I also discussed with the patient that there may be a patient responsible charge related to the service.  The patient expressed understanding and agrees to proceed.    Chief Complaint  Patient presents with  . Telephone Assessment  . Telephone Screen  . Medication Refill    The patient has been contacted via webcam for follow up visit due to concerns for spread of novel coronavirus. The patient is following up for hypertension. Blood pressure is doing well. Blood pressure 138/82 today. Doing well with lexapro at current dose. Generally takes 1 tablet daily. There are a few times when she will take two on days that depression is worse. This dosing is working well for her.       Current Medication: Outpatient Encounter Medications as of 10/03/2018  Medication Sig  . escitalopram (LEXAPRO) 5 MG tablet Take 1 to 2 tablets po QPM  . losartan (COZAAR) 50 MG tablet Take 1.5 tablets (75 mg total) by mouth daily.  Marland Kitchen omeprazole (PRILOSEC) 10 MG capsule Take 10 mg by mouth daily as needed.  . [DISCONTINUED] escitalopram (LEXAPRO) 5 MG tablet Take 1 to 2 tablets po QPM  . [DISCONTINUED] losartan (COZAAR) 50 MG tablet Take 1.5 tablets (75 mg total) by mouth daily.  . clotrimazole-betamethasone (LOTRISONE) cream Apply 1 application topically 2 (two) times daily. (Patient not taking: Reported on 10/03/2018)   No facility-administered encounter medications on file as of 10/03/2018.     Surgical History: Past Surgical History:  Procedure  Laterality Date  . NO PAST SURGERIES      Medical History: Past Medical History:  Diagnosis Date  . Chest pain   . HTN (hypertension)   . Hypoglycemia   . Hypokalemia     Family History: Family History  Problem Relation Age of Onset  . Heart disease Mother   . Coronary artery disease Mother 21  . Heart failure Mother        chf  . Diabetes Mother   . Heart disease Father        Defibrillator  . Diabetes Father   . Diabetes Sister     Social History   Socioeconomic History  . Marital status: Married    Spouse name: Not on file  . Number of children: 3  . Years of education: Not on file  . Highest education level: Not on file  Occupational History  . 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 Financial resource strain: Not on file  . Food insecurity    Worry: Not on file    Inability: Not on file  . Transportation needs    Medical: Not on file    Non-medical: Not on file  Tobacco Use  . Smoking status: Never Smoker  . Smokeless tobacco: Never Used  Substance and Sexual Activity  . Alcohol use: Not Currently    Comment: ocassionally  . Drug use: No  . Sexual activity: Not on file  Lifestyle  . Physical activity  Days per week: Not on file    Minutes per session: Not on file  . Stress: Not on file  Relationships  . Social Herbalist on phone: Not on file    Gets together: Not on file    Attends religious service: Not on file    Active member of club or organization: Not on file    Attends meetings of clubs or organizations: Not on file    Relationship status: Not on file  . Intimate partner violence    Fear of current or ex partner: Not on file    Emotionally abused: Not on file    Physically abused: Not on file    Forced sexual activity: Not on file  Other Topics Concern  . Not on file  Social History Narrative  . Not on file      Review of Systems  Constitutional:  Negative for chills, fatigue and unexpected weight change.  HENT: Negative for congestion, postnasal drip, rhinorrhea, sneezing and sore throat.   Eyes: Negative.   Respiratory: Negative for cough, chest tightness, shortness of breath and wheezing.   Cardiovascular: Negative for chest pain and palpitations.       Improved control of blood pressure recently.   Gastrointestinal: Negative for abdominal pain, constipation, diarrhea, nausea and vomiting.  Endocrine: Negative for cold intolerance, heat intolerance, polydipsia and polyuria.  Musculoskeletal: Negative for arthralgias, back pain, joint swelling and neck pain.  Skin: Positive for rash.       Rash on her feet has resolved. Still gets eczematous flares on the hands, especially during the winter months.   Allergic/Immunologic: Negative for environmental allergies.  Neurological: Negative for dizziness, tremors, numbness and headaches.  Hematological: Negative for adenopathy. Does not bruise/bleed easily.  Psychiatric/Behavioral: Positive for dysphoric mood. Negative for behavioral problems (Depression), sleep disturbance and suicidal ideas. The patient is not nervous/anxious.     Today's Vitals   10/03/18 1527  BP: 138/82  Weight: 198 lb (89.8 kg)  Height: 5\' 5"  (1.651 m)   Body mass index is 32.95 kg/m.  Observation/Objective:    The patient is alert and oriented. She is pleasant and answers all questions appropriately. Breathing is non-labored. She is in no acute distress at this time.    Assessment/Plan: 1. Essential hypertension Continue losartan as prescribed.  - losartan (COZAAR) 50 MG tablet; Take 1.5 tablets (75 mg total) by mouth daily.  Dispense: 45 tablet; Refill: 3  2. Depression, major, single episode, mild (HCC) Stable. Continue lexapro as prescribed.  - escitalopram (LEXAPRO) 5 MG tablet; Take 1 to 2 tablets po QPM  Dispense: 45 tablet; Refill: 3  General Counseling: [name] verbalizes understanding of the  findings of today's phone visit and agrees with plan of treatment. I have discussed any further diagnostic evaluation that may be needed or ordered today. We also reviewed her medications today. she has been encouraged to call the office with any questions or concerns that should arise related to todays visit.  Hypertension Counseling:   The following hypertensive lifestyle modification were recommended and discussed:  1. Limiting alcohol intake to less than 1 oz/day of ethanol:(24 oz of beer or 8 oz of wine or 2 oz of 100-proof whiskey). 2. Take baby ASA 81 mg daily. 3. Importance of regular aerobic exercise and losing weight. 4. Reduce dietary saturated fat and cholesterol intake for overall cardiovascular health. 5. Maintaining adequate dietary potassium, calcium, and magnesium intake. 6. Regular monitoring of the blood pressure. 7. Reduce  sodium intake to less than 100 mmol/day (less than 2.3 gm of sodium or less than 6 gm of sodium choride)   This patient was seen by [place] with Dr Lavera Guise as a part of collaborative care agreement  Meds ordered this encounter  Medications  . escitalopram (LEXAPRO) 5 MG tablet    Sig: Take 1 to 2 tablets po QPM    Dispense:  45 tablet    Refill:  3    Order Specific Question:   Supervising Provider    Answer:   Lavera Guise [place]  . losartan (COZAAR) 50 MG tablet    Sig: Take 1.5 tablets (75 mg total) by mouth daily.    Dispense:  45 tablet    Refill:  3    Please note increase in dosing.    Order Specific Question:   Supervising Provider    Answer:   Lavera Guise [[id number]]    Time spent: 39 Minutes    Dr Lavera Guise Internal medicine

## 2018-11-07 ENCOUNTER — Encounter: Payer: Self-pay | Admitting: Nurse Practitioner

## 2018-12-15 ENCOUNTER — Ambulatory Visit (INDEPENDENT_AMBULATORY_CARE_PROVIDER_SITE_OTHER): Payer: No Typology Code available for payment source | Admitting: Nurse Practitioner

## 2018-12-15 ENCOUNTER — Encounter: Payer: Self-pay | Admitting: Nurse Practitioner

## 2018-12-15 ENCOUNTER — Other Ambulatory Visit: Payer: Self-pay

## 2018-12-15 VITALS — BP 140/80 | HR 86 | Resp 16 | Ht 65.0 in | Wt 209.0 lb

## 2018-12-15 DIAGNOSIS — Z1239 Encounter for other screening for malignant neoplasm of breast: Secondary | ICD-10-CM

## 2018-12-15 DIAGNOSIS — Z0001 Encounter for general adult medical examination with abnormal findings: Secondary | ICD-10-CM

## 2018-12-15 DIAGNOSIS — I10 Essential (primary) hypertension: Secondary | ICD-10-CM | POA: Diagnosis not present

## 2018-12-15 DIAGNOSIS — R300 Dysuria: Secondary | ICD-10-CM

## 2018-12-15 DIAGNOSIS — N390 Urinary tract infection, site not specified: Secondary | ICD-10-CM | POA: Diagnosis not present

## 2018-12-15 NOTE — Progress Notes (Signed)
[facility] [place], [place] [address]  Internal MEDICINE  Office Visit Note  Patient Name: [name]  [id number]  [id number]  Date of Service: 12/24/2018   Pt is here for routine health maintenance examination  Chief Complaint  Patient presents with  . Annual Exam  . Hypertension     The patient is here for health maintenance exam. She states that she is doing well and she has no concerns or complaints today. Blood pressure is well controlled on current medication. She is due to have routine, fasting labs as well as screening mammogram.     Current Medication: Outpatient Encounter Medications as of 12/15/2018  Medication Sig Note  . escitalopram (LEXAPRO) 5 MG tablet Take 1 to 2 tablets po QPM   . losartan (COZAAR) 50 MG tablet Take 1.5 tablets (75 mg total) by mouth daily.   Marland Kitchen omeprazole (PRILOSEC) 10 MG capsule Take 10 mg by mouth daily as needed.   . [DISCONTINUED] amoxicillin-clavulanate (AUGMENTIN) 875-125 MG tablet Take 1 tablet by mouth 2 (two) times daily. 12/20/2018: causing nausea and vomiting.   . [DISCONTINUED] clotrimazole-betamethasone (LOTRISONE) cream Apply 1 application topically 2 (two) times daily. (Patient not taking: Reported on 10/03/2018)    No facility-administered encounter medications on file as of 12/15/2018.     Surgical History: Past Surgical History:  Procedure Laterality Date  . NO PAST SURGERIES      Medical History: Past Medical History:  Diagnosis Date  . Chest pain   . HTN (hypertension)   . Hypoglycemia   . Hypokalemia     Family History: Family History  Problem Relation Age of Onset  . Heart disease Mother   . Coronary artery disease Mother 74  . Heart failure Mother        chf  . Diabetes Mother   . Heart disease Father        Defibrillator  . Diabetes Father   . Diabetes Sister       Review of Systems  Constitutional: Negative for chills, fatigue and unexpected weight change.  HENT:  Negative for congestion, postnasal drip, rhinorrhea, sneezing and sore throat.   Respiratory: Negative for cough, chest tightness, shortness of breath and wheezing.   Cardiovascular: Negative for chest pain and palpitations.       Blood pressure is well controlled.   Gastrointestinal: Negative for abdominal pain, constipation, diarrhea, nausea and vomiting.  Endocrine: Negative for cold intolerance, heat intolerance, polydipsia and polyuria.  Genitourinary: Negative for flank pain and pelvic pain.  Musculoskeletal: Negative for arthralgias, back pain, joint swelling and neck pain.  Skin: Negative for rash.  Allergic/Immunologic: Negative for environmental allergies.  Neurological: Negative for dizziness, tremors, numbness and headaches.  Hematological: Negative for adenopathy. Does not bruise/bleed easily.  Psychiatric/Behavioral: Positive for dysphoric mood. Negative for behavioral problems (Depression), sleep disturbance and suicidal ideas. The patient is not nervous/anxious.     Today's Vitals   12/15/18 1100  BP: 140/80  Pulse: 86  Resp: 16  SpO2: 97%  Weight: 209 lb (94.8 kg)  Height: 5\' 5"  (1.651 m)   Body mass index is 34.78 kg/m.  Physical Exam Vitals signs and nursing note reviewed.  Constitutional:      General: She is not in acute distress.    Appearance: Normal appearance. She is well-developed. She is not diaphoretic.  HENT:     Head: Normocephalic and atraumatic.     Mouth/Throat:     Pharynx: No oropharyngeal exudate.  Eyes:  Pupils: Pupils are equal, round, and reactive to light.  Neck:     Musculoskeletal: Normal range of motion and neck supple.     Thyroid: No thyromegaly.     Vascular: No JVD.     Trachea: No tracheal deviation.  Cardiovascular:     Rate and Rhythm: Normal rate and regular rhythm.     Pulses: Normal pulses.     Heart sounds: Normal heart sounds. No murmur. No friction rub. No gallop.   Pulmonary:     Effort: Pulmonary effort is  normal. No respiratory distress.     Breath sounds: Normal breath sounds. No wheezing or rales.  Chest:     Chest wall: No tenderness.     Breasts:        Right: Normal. No swelling, bleeding, inverted nipple, mass, nipple discharge, skin change or tenderness.        Left: Normal. No swelling, bleeding, inverted nipple, mass, nipple discharge, skin change or tenderness.  Abdominal:     General: Bowel sounds are normal.     Palpations: Abdomen is soft.     Tenderness: There is no abdominal tenderness.  Musculoskeletal: Normal range of motion.  Lymphadenopathy:     Cervical: No cervical adenopathy.  Skin:    General: Skin is warm and dry.  Neurological:     Mental Status: She is alert and oriented to person, place, and time.     Cranial Nerves: No cranial nerve deficit.  Psychiatric:        Behavior: Behavior normal.        Thought Content: Thought content normal.        Judgment: Judgment normal.      LABS: Recent Results (from the past 2160 hour(s))  UA/M w/rflx Culture, Routine     Status: None   Collection Time: 12/15/18 11:06 AM   Specimen: Urine   URINE  Result Value Ref Range   Specific Gravity, UA 1.017 1.005 - 1.030   pH, UA 6.5 5.0 - 7.5   Color, UA Yellow Yellow   Appearance Ur Clear Clear   Leukocytes,UA Negative Negative   Protein,UA Negative Negative/Trace   Glucose, UA Negative Negative   Ketones, UA Negative Negative   RBC, UA Negative Negative   Bilirubin, UA Negative Negative   Urobilinogen, Ur 0.2 0.2 - 1.0 mg/dL   Nitrite, UA Negative Negative   Microscopic Examination Comment     Comment: Microscopic follows if indicated.   Microscopic Examination See below:     Comment: Microscopic was indicated and was performed.   Urinalysis Reflex Comment     Comment: This specimen has reflexed to a Urine Culture.  Microscopic Examination     Status: Abnormal   Collection Time: 12/15/18 11:06 AM   URINE  Result Value Ref Range   WBC, UA 0-5 0 - 5 /hpf    RBC 0-2 0 - 2 /hpf   Epithelial Cells (non renal) 0-10 0 - 10 /hpf   Casts None seen None seen /lpf   Crystals Present (A) N/A   Crystal Type Calcium Oxalate N/A   Mucus, UA Present Not Estab.   Bacteria, UA Many (A) None seen/Few  Urine Culture, Reflex     Status: Abnormal   Collection Time: 12/15/18 11:06 AM   URINE  Result Value Ref Range   Urine Culture, Routine Final report (A)    Organism ID, Bacteria Comment (A)     Comment: Beta hemolytic Streptococcus, group B Greater than 100,000 colony  forming units per mL Penicillin and ampicillin are drugs of choice for treatment of beta-hemolytic streptococcal infections. Susceptibility testing of penicillins and other beta-lactam agents approved by the FDA for treatment of beta-hemolytic streptococcal infections need not be performed routinely because nonsusceptible isolates are extremely rare in any beta-hemolytic streptococcus and have not been reported for Streptococcus pyogenes (group A). (CLSI)     Assessment/Plan: 1. Encounter for general adult medical examination with abnormal findings Annual health maintenance exam today. Lab slip given to have routine, fasting labs drawn.   2. Essential hypertension Stable. Continue bp medication as prescribed   3. Urinary tract infection without hematuria, site unspecified Treat with amoxicillin 875mg  twice daily for 7 days.   4. Screening for breast cancer - MM DIGITAL SCREENING BILATERAL; Future  5. Dysuria - UA/M w/rflx Culture, Routine  General Counseling: Hetvi verbalizes understanding of the findings of todays visit and agrees with plan of treatment. I have discussed any further diagnostic evaluation that may be needed or ordered today. We also reviewed her medications today. she has been encouraged to call the office with any questions or concerns that should arise related to todays visit.    Counseling:  This patient was seen by Leretha Pol FNP Collaboration with Dr  Lavera Guise as a part of collaborative care agreement  Orders Placed This Encounter  Procedures  . Microscopic Examination  . Urine Culture, Reflex  . MM DIGITAL SCREENING BILATERAL  . UA/M w/rflx Culture, Routine    Meds ordered this encounter  Medications  . DISCONTD: amoxicillin-clavulanate (AUGMENTIN) 875-125 MG tablet    Sig: Take 1 tablet by mouth 2 (two) times daily.    Dispense:  20 tablet    Refill:  0    Order Specific Question:   Supervising Provider    Answer:   Lavera Guise [id number]    Time spent: [place], MD  Internal Medicine

## 2018-12-15 NOTE — Patient Instructions (Signed)
Chronic Ankle Instability Rehab Ask your health care provider which exercises are safe for you. Do exercises exactly as told by your health care provider and adjust them as directed. It is normal to feel mild stretching, pulling, tightness, or discomfort as you do these exercises. Stop right away if you feel sudden pain or your pain gets worse. Do not begin these exercises until told by your health care provider. Strengthening exercises These exercises build strength and endurance in your ankle. Endurance is the ability to use your muscles for a long time, even after they get tired. Ankle eversion 1. Sit on the floor with your legs straight out in front of you. 2. Loop a rubber exercise band around the ball of your left / right foot. The ball of your foot is on the walking surface, right under your toes. Hold the ends of the band in your hands, or secure the band to a stable object. 3. Slowly push your foot outward, away from your other leg (eversion). 4. Hold this position for __________ seconds. 5. Slowly return your foot to the starting position. Repeat __________ times. Complete this exercise __________ times a day. Heel walking  This exercise strengthens the muscles that push the ankle backward to point your toes toward your knee (ankle dorsiflexors). Walk on your heels for __________. Keep your toes as high as possible. Repeat __________ times. Complete this exercise __________ times per day. Toe walking  This exercise strengthens the muscles that push the ankle forward and your toes downward (ankle plantar flexors). Walk on your toes for __________. Keep your heels as high as possible. Repeat __________ times. Complete this exercise __________ times per day. Balance exercises These exercises improve or maintain your balance. Balance is important in improving ankle stability and preventing falls. Tandem walking Do this exercise in a hallway or room that is at least 10 ft (3 m) long. 1.  Stand with one foot directly in front of the other (tandem). You can use the walls to help you balance if needed, but try not to use them for support. 2. Slowly lift your back foot and place it directly in front of your other foot. 3. Continue to walk in this heel-to-toe way for __________. Repeat __________ times. Complete this exercise __________ times a day. Single leg stand 1. Without shoes, stand near a railing or in a doorway. You may hold on to the railing or door frame as needed for balance. 2. Stand on your left / right foot. Keep your big toe down on the floor and try to keep your arch lifted. If this is too easy, you can try doing it while you do one of these actions: ? Keep your eyes closed. ? Stand on a pillow. ? Throw a ball against a wall and catch it when it returns. 3. Hold this position for __________ seconds. Repeat __________ times. Complete this exercise __________ times a day. Ankle inversion and ankle eversion This exercise is also called foot rotation with a balance board. It uses a balance board to rotate the foot and ankle inward (inversion) and outward (eversion). Ask your health care provider where you can get a balance board or how you can make one. 1. Stand on a non-carpeted surface near a countertop or wall. 2. Step onto the balance board so your feet are hip width apart. 3. Keep your feet in place, and keep your upper body and hips steady. 4. Using only your feet and ankles to move the board,  do the following exercises: ? Tip the board from side to side as far as you can, alternating between tipping to the left and tipping to the right. ? Tip the board so it silently taps the floor. Do not let the board forcefully hit the floor. ? From time to time, pause to hold a steady midway position, with neither the right nor the left sides touching the ground. ? Tip the board side to side so the board does not hit the floor at all. From time to time, pause to hold a steady  midway position. Repeat __________ times, pausing from time to time to hold a steady position. Complete this exercise __________ times a day. Ankle plantar flexion and ankle dorsiflexion This exercise is also called foot flexion with a balance board. It uses a balance board to push the foot downward and away from the leg (plantar flexion) or upward and toward the leg (dorsiflexion). Ask your health care provider where you can get a balance board or how you can make one. 1. Stand on a non-carpeted surface near a countertop or wall. 2. Step onto the balance board so your feet are hip width apart. 3. Keep your feet in place, and keep your upper body and hips steady. 4. Using only your feet and ankles, do the following exercises: ? Tip the board forward and backward so the board silently taps the floor. Do not let the board forcefully hit the floor. ? From time to time, pause to hold a steady position midway between touching the floor in front and touching the floor in back. ? Tip the board forward and backward so the board does not hit the floor at all. From time to time, pause to hold a steady position. Repeat __________ times, pausing from time to time to hold a steady position. Complete this exercise __________ times a day. This information is not intended to replace advice given to you by your health care provider. Make sure you discuss any questions you have with your health care provider. Document Released: 10/21/2004 Document Revised: 07/13/2018 Document Reviewed: 01/03/2018 Elsevier Patient Education  2020 [employer].

## 2018-12-17 LAB — UA/M W/RFLX CULTURE, ROUTINE
Bilirubin, UA: NEGATIVE
Glucose, UA: NEGATIVE
Ketones, UA: NEGATIVE
Leukocytes,UA: NEGATIVE
Nitrite, UA: NEGATIVE
Protein,UA: NEGATIVE
RBC, UA: NEGATIVE
Specific Gravity, UA: 1.017 (ref 1.005–1.030)
Urobilinogen, Ur: 0.2 mg/dL (ref 0.2–1.0)
pH, UA: 6.5 (ref 5.0–7.5)

## 2018-12-17 LAB — MICROSCOPIC EXAMINATION: Casts: NONE SEEN /lpf

## 2018-12-17 LAB — URINE CULTURE, REFLEX

## 2018-12-18 ENCOUNTER — Telehealth: Payer: Self-pay

## 2018-12-18 MED ORDER — AMOXICILLIN-POT CLAVULANATE 875-125 MG PO TABS: 1.0000 | ORAL_TABLET | Freq: Two times a day (BID) | ORAL | 0 refills | Status: DC

## 2018-12-18 NOTE — Progress Notes (Signed)
Please let the patient know that urine sample from physical showed infection. I have added augmentin 875mg  bid for next 10 days. [facility] Thanks.

## 2018-12-18 NOTE — Telephone Encounter (Signed)
lmom to call us back

## 2018-12-19 ENCOUNTER — Telehealth: Payer: Self-pay

## 2018-12-19 NOTE — Telephone Encounter (Signed)
Pt advised urine did showed infection we send antibiotic to [facility]

## 2018-12-19 NOTE — Telephone Encounter (Signed)
-----   Message from Ronnell Freshwater, NP sent at 12/18/2018  1:19 PM EDT ----- Please let the patient know that urine sample from physical showed infection. I have added augmentin 875mg  bid for next 10 days. [facility]. Thanks.

## 2018-12-20 ENCOUNTER — Telehealth: Payer: Self-pay

## 2018-12-20 ENCOUNTER — Other Ambulatory Visit: Payer: Self-pay | Admitting: Nurse Practitioner

## 2018-12-20 DIAGNOSIS — N390 Urinary tract infection, site not specified: Secondary | ICD-10-CM

## 2018-12-20 MED ORDER — AMOXICILLIN 875 MG PO TABS: 875.0000 mg | ORAL_TABLET | Freq: Two times a day (BID) | ORAL | 0 refills | Status: DC

## 2018-12-20 NOTE — Telephone Encounter (Signed)
Changed augmentin to just amoxicillin 875mg  bid for 7 days. [facility] .

## 2018-12-20 NOTE — Telephone Encounter (Signed)
Pt advised we change med to amoxicillin and start tomorrow and drink plenty of water

## 2018-12-20 NOTE — Progress Notes (Signed)
Changed augmentin to just amoxicillin 875mg  bid for 7 days. [facility] .

## 2018-12-24 DIAGNOSIS — N390 Urinary tract infection, site not specified: Secondary | ICD-10-CM | POA: Insufficient documentation

## 2018-12-24 DIAGNOSIS — Z0000 Encounter for general adult medical examination without abnormal findings: Secondary | ICD-10-CM | POA: Insufficient documentation

## 2018-12-24 DIAGNOSIS — Z0001 Encounter for general adult medical examination with abnormal findings: Secondary | ICD-10-CM | POA: Insufficient documentation

## 2019-01-23 ENCOUNTER — Other Ambulatory Visit: Payer: Self-pay | Admitting: Nurse Practitioner

## 2019-01-23 DIAGNOSIS — F320 Major depressive disorder, single episode, mild: Secondary | ICD-10-CM

## 2019-01-23 MED ORDER — ESCITALOPRAM OXALATE 5 MG PO TABS: ORAL_TABLET | ORAL | 3 refills | Status: DC

## 2019-04-27 ENCOUNTER — Other Ambulatory Visit: Payer: Self-pay

## 2019-04-27 DIAGNOSIS — I10 Essential (primary) hypertension: Secondary | ICD-10-CM

## 2019-04-27 MED ORDER — LOSARTAN POTASSIUM 50 MG PO TABS: 75.0000 mg | ORAL_TABLET | Freq: Every day | ORAL | 3 refills | Status: DC

## 2019-05-29 ENCOUNTER — Other Ambulatory Visit: Payer: Self-pay

## 2019-05-29 DIAGNOSIS — F320 Major depressive disorder, single episode, mild: Secondary | ICD-10-CM

## 2019-05-29 MED ORDER — ESCITALOPRAM OXALATE 5 MG PO TABS: ORAL_TABLET | ORAL | 1 refills | Status: DC

## 2019-06-13 ENCOUNTER — Telehealth: Payer: Self-pay

## 2019-06-13 NOTE — Telephone Encounter (Signed)
CONFIRMED AND SCREENED FOR 06-15-19 OV.

## 2019-06-15 ENCOUNTER — Ambulatory Visit (INDEPENDENT_AMBULATORY_CARE_PROVIDER_SITE_OTHER): Payer: No Typology Code available for payment source | Admitting: Nurse Practitioner

## 2019-06-15 ENCOUNTER — Other Ambulatory Visit: Payer: Self-pay

## 2019-06-15 ENCOUNTER — Encounter: Payer: Self-pay | Admitting: Nurse Practitioner

## 2019-06-15 VITALS — BP 129/71 | HR 86 | Temp 97.6°F | Resp 16 | Ht 65.0 in | Wt 223.0 lb

## 2019-06-15 DIAGNOSIS — R300 Dysuria: Secondary | ICD-10-CM

## 2019-06-15 DIAGNOSIS — F320 Major depressive disorder, single episode, mild: Secondary | ICD-10-CM | POA: Diagnosis not present

## 2019-06-15 DIAGNOSIS — N390 Urinary tract infection, site not specified: Secondary | ICD-10-CM

## 2019-06-15 DIAGNOSIS — I10 Essential (primary) hypertension: Secondary | ICD-10-CM

## 2019-06-15 LAB — POCT URINALYSIS DIPSTICK
Bilirubin, UA: NEGATIVE
Glucose, UA: NEGATIVE
Protein, UA: POSITIVE — AB
Spec Grav, UA: 1.015 (ref 1.010–1.025)
Urobilinogen, UA: 0.2 E.U./dL
pH, UA: 5 (ref 5.0–8.0)

## 2019-06-15 MED ORDER — LOSARTAN POTASSIUM 50 MG PO TABS: 75.0000 mg | ORAL_TABLET | Freq: Every day | ORAL | 3 refills | Status: DC

## 2019-06-15 MED ORDER — AMOXICILLIN 875 MG PO TABS: 875.0000 mg | ORAL_TABLET | Freq: Two times a day (BID) | ORAL | 0 refills | Status: DC

## 2019-06-15 MED ORDER — ESCITALOPRAM OXALATE 5 MG PO TABS: ORAL_TABLET | ORAL | 3 refills | Status: DC

## 2019-06-15 NOTE — Progress Notes (Signed)
[facility] [place], [place] [address]  Internal MEDICINE  Office Visit Note  Patient Name: [name]  [id number]  [id number]  Date of Service: 06/20/2019  Chief Complaint  Patient presents with  . Hypertension  . Urinary Tract Infection    possible  . Back Pain    back aches    The patient is here for follow up visit. Today, she is noting an odor in her urine. She is also having frequency of urination and bladder spasms. She is also having some flank pain.  She is concerned about recent weight gain. Diet choices have been good. She states that she has not been exercising like she should. Has been hard with isolation precautions from COVID 19 and winter hours.       Current Medication: Outpatient Encounter Medications as of 06/15/2019  Medication Sig  . amoxicillin (AMOXIL) 875 MG tablet Take 1 tablet (875 mg total) by mouth 2 (two) times daily.  Marland Kitchen escitalopram (LEXAPRO) 5 MG tablet Take 2 tablets po qpm  . losartan (COZAAR) 50 MG tablet Take 1.5 tablets (75 mg total) by mouth daily.  Marland Kitchen omeprazole (PRILOSEC) 10 MG capsule Take 10 mg by mouth daily as needed.  . [DISCONTINUED] amoxicillin (AMOXIL) 875 MG tablet Take 1 tablet (875 mg total) by mouth 2 (two) times daily.  . [DISCONTINUED] escitalopram (LEXAPRO) 5 MG tablet Take 1 to 2 tablets po QPM  . [DISCONTINUED] losartan (COZAAR) 50 MG tablet Take 1.5 tablets (75 mg total) by mouth daily.   No facility-administered encounter medications on file as of 06/15/2019.    Surgical History: Past Surgical History:  Procedure Laterality Date  . NO PAST SURGERIES      Medical History: Past Medical History:  Diagnosis Date  . Chest pain   . HTN (hypertension)   . Hypoglycemia   . Hypokalemia     Family History: Family History  Problem Relation Age of Onset  . Heart disease Mother   . Coronary artery disease Mother 55  . Heart failure Mother        chf  . Diabetes Mother   . Heart disease  Father        Defibrillator  . Diabetes Father   . Diabetes Sister     Social History   Socioeconomic History  . Marital status: Married    Spouse name: Not on file  . Number of children: 3  . Years of education: Not on file  . Highest education level: Not on file  Occupational History  . 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 Smoking status: Never Smoker  . Smokeless tobacco: Never Used  Substance and Sexual Activity  . Alcohol use: Not Currently    Comment: ocassionally  . Drug use: No  . Sexual activity: Not on file  Other Topics Concern  . Not on file  Social History Narrative  . Not on file   Social Determinants of Health   Financial Resource Strain:   . Difficulty of Paying Living Expenses:   Food Insecurity:   . Worried About Charity fundraiser in the Last Year:   . Arboriculturist in the Last Year:   Transportation Needs:   . Film/video editor (Medical):   Marland Kitchen Lack of Transportation (Non-Medical):   Physical Activity:   . Days of Exercise per Week:   . Minutes of Exercise  per Session:   Stress:   . Feeling of Stress :   Social Connections:   . Frequency of Communication with Friends and Family:   . Frequency of Social Gatherings with Friends and Family:   . Attends Religious Services:   . Active Member of Clubs or Organizations:   . Attends Archivist Meetings:   Marland Kitchen Marital Status:   Intimate Partner Violence:   . Fear of Current or Ex-Partner:   . Emotionally Abused:   Marland Kitchen Physically Abused:   . Sexually Abused:       Review of Systems  Constitutional: Negative for chills, fatigue and unexpected weight change.       Weight gain of 14 pounds since her last visit.   HENT: Negative for congestion, postnasal drip, rhinorrhea, sneezing and sore throat.   Respiratory: Negative for cough, chest tightness, shortness of breath and wheezing.   Cardiovascular: Negative for chest  pain and palpitations.       Blood pressure is well controlled.   Gastrointestinal: Negative for abdominal pain, constipation, diarrhea, nausea and vomiting.  Endocrine: Negative for cold intolerance, heat intolerance, polydipsia and polyuria.  Genitourinary: Positive for dysuria and frequency. Negative for flank pain and pelvic pain.  Musculoskeletal: Negative for arthralgias, back pain, joint swelling and neck pain.  Skin: Negative for rash.  Allergic/Immunologic: Negative for environmental allergies.  Neurological: Negative for dizziness, tremors, numbness and headaches.  Hematological: Negative for adenopathy. Does not bruise/bleed easily.  Psychiatric/Behavioral: Positive for dysphoric mood. Negative for behavioral problems (Depression), sleep disturbance and suicidal ideas. The patient is not nervous/anxious.     Today's Vitals   06/15/19 0936  BP: 129/71  Pulse: 86  Resp: 16  Temp: 97.6 F (36.4 C)  SpO2: 96%  Weight: 223 lb (101.2 kg)  Height: 5\' 5"  (1.651 m)   Body mass index is 37.11 kg/m.  Physical Exam Vitals and nursing note reviewed.  Constitutional:      General: She is not in acute distress.    Appearance: Normal appearance. She is well-developed. She is not diaphoretic.  HENT:     Head: Normocephalic and atraumatic.     Nose: Nose normal.     Mouth/Throat:     Pharynx: No oropharyngeal exudate.  Eyes:     Pupils: Pupils are equal, round, and reactive to light.  Neck:     Thyroid: No thyromegaly.     Vascular: No JVD.     Trachea: No tracheal deviation.  Cardiovascular:     Rate and Rhythm: Normal rate and regular rhythm.     Heart sounds: Normal heart sounds. No murmur. No friction rub. No gallop.   Pulmonary:     Effort: Pulmonary effort is normal. No respiratory distress.     Breath sounds: Normal breath sounds. No wheezing or rales.  Chest:     Chest wall: No tenderness.  Abdominal:     General: Bowel sounds are normal.     Palpations: Abdomen  is soft.     Tenderness: There is no abdominal tenderness.  Genitourinary:    Comments: Urine sample positive for protein. There is also trace WBC, blood, and ketones.  Musculoskeletal:        General: Normal range of motion.     Cervical back: Normal range of motion and neck supple.  Lymphadenopathy:     Cervical: No cervical adenopathy.  Skin:    General: Skin is warm and dry.  Neurological:     Mental Status: She is  alert and oriented to person, place, and time.     Cranial Nerves: No cranial nerve deficit.  Psychiatric:        Mood and Affect: Mood normal.        Behavior: Behavior normal.        Thought Content: Thought content normal.        Judgment: Judgment normal.    Assessment/Plan: 1. Essential hypertension Stable. Continue losartan as prescribed  - losartan (COZAAR) 50 MG tablet; Take 1.5 tablets (75 mg total) by mouth daily.  Dispense: 45 tablet; Refill: 3  2. Urinary tract infection without hematuria, site unspecified Start amoxicillin 875mg  bid for 7 day. Send urine for culture and sensitivity and adjust antibiotics as indicated.  - amoxicillin (AMOXIL) 875 MG tablet; Take 1 tablet (875 mg total) by mouth 2 (two) times daily.  Dispense: 14 tablet; Refill: 0  3. Dysuria - POCT Urinalysis Dipstick - CULTURE, URINE COMPREHENSIVE  4. Depression, major, single episode, mild ([place]) Continue lexapro as prescribed  - escitalopram (LEXAPRO) 5 MG tablet; Take 2 tablets po qpm  Dispense: 60 tablet; Refill: 3  General Counseling: Correna verbalizes understanding of the findings of todays visit and agrees with plan of treatment. I have discussed any further diagnostic evaluation that may be needed or ordered today. We also reviewed her medications today. she has been encouraged to call the office with any questions or concerns that should arise related to todays visit.  This patient was seen by Leretha Pol FNP Collaboration with Dr Lavera Guise as a part of collaborative care  agreement  Orders Placed This Encounter  Procedures  . CULTURE, URINE COMPREHENSIVE  . POCT Urinalysis Dipstick    Meds ordered this encounter  Medications  . escitalopram (LEXAPRO) 5 MG tablet    Sig: Take 2 tablets po qpm    Dispense:  60 tablet    Refill:  3    Order Specific Question:   Supervising Provider    Answer:   Lavera Guise [place]  . losartan (COZAAR) 50 MG tablet    Sig: Take 1.5 tablets (75 mg total) by mouth daily.    Dispense:  45 tablet    Refill:  3    Please note increase in dosing.    Order Specific Question:   Supervising Provider    Answer:   Lavera Guise [id number]  . amoxicillin (AMOXIL) 875 MG tablet    Sig: Take 1 tablet (875 mg total) by mouth 2 (two) times daily.    Dispense:  14 tablet    Refill:  0    Order Specific Question:   Supervising Provider    Answer:   Lavera Guise [id number]    Total time spent: 25 Minutes   Time spent includes review of chart, medications, test results, and follow up plan with the patient.      Dr Lavera Guise Internal medicine

## 2019-06-18 NOTE — Progress Notes (Signed)
Started on amoxicillin at visit.

## 2019-06-20 LAB — CULTURE, URINE COMPREHENSIVE

## 2019-06-25 ENCOUNTER — Telehealth: Payer: Self-pay

## 2019-06-25 ENCOUNTER — Other Ambulatory Visit: Payer: Self-pay | Admitting: Nurse Practitioner

## 2019-06-25 DIAGNOSIS — N390 Urinary tract infection, site not specified: Secondary | ICD-10-CM

## 2019-06-25 MED ORDER — NITROFURANTOIN MONOHYD MACRO 100 MG PO CAPS: 100.0000 mg | ORAL_CAPSULE | Freq: Two times a day (BID) | ORAL | 0 refills | Status: DC

## 2019-06-25 NOTE — Progress Notes (Signed)
Will do round of macrobid 100mg  twice daily for 7 days. Ok to continue to eat yogurt

## 2019-06-25 NOTE — Telephone Encounter (Signed)
Will do round of macrobid 100mg  twice daily for 7 days. Ok to continue to eat yogurt

## 2019-06-25 NOTE — Telephone Encounter (Signed)
Urine culture only positive for lactobacillus, which is probiotic. Does she take probiotics or eat a great deal of yogurt? May need to try different antibiotic. Is there any antibiotics she is unable to take?

## 2019-06-25 NOTE — Telephone Encounter (Signed)
Pt was notified. Pt states that eats yogurt about every other day. Unable to tolerate augmentin.

## 2019-06-25 NOTE — Telephone Encounter (Signed)
Pt was notified.  

## 2019-08-10 ENCOUNTER — Telehealth: Payer: Self-pay

## 2019-08-10 NOTE — Telephone Encounter (Signed)
FAXED [place] ON 08/09/19.

## 2019-09-07 ENCOUNTER — Encounter: Payer: Self-pay | Admitting: Radiology

## 2019-09-07 ENCOUNTER — Ambulatory Visit
Admission: RE | Admit: 2019-09-07 | Discharge: 2019-09-07 | Disposition: A | Discharge status: Home / Self Care | Payer: No Typology Code available for payment source | Source: Ambulatory Visit | Attending: Nurse Practitioner | Admitting: Nurse Practitioner

## 2019-09-07 DIAGNOSIS — Z1231 Encounter for screening mammogram for malignant neoplasm of breast: Secondary | ICD-10-CM | POA: Diagnosis present

## 2019-09-07 DIAGNOSIS — Z1239 Encounter for other screening for malignant neoplasm of breast: Secondary | ICD-10-CM

## 2019-09-07 NOTE — Progress Notes (Signed)
Negative mammogram

## 2019-09-09 LAB — COLOGUARD: COLOGUARD: NEGATIVE

## 2019-09-09 LAB — EXTERNAL GENERIC LAB PROCEDURE: COLOGUARD: NEGATIVE

## 2019-10-25 ENCOUNTER — Encounter: Payer: Self-pay | Admitting: Nurse Practitioner

## 2019-10-25 ENCOUNTER — Ambulatory Visit (INDEPENDENT_AMBULATORY_CARE_PROVIDER_SITE_OTHER): Payer: No Typology Code available for payment source | Admitting: Nurse Practitioner

## 2019-10-25 ENCOUNTER — Telehealth (HOSPITAL_COMMUNITY): Payer: Self-pay

## 2019-10-25 ENCOUNTER — Other Ambulatory Visit: Payer: Self-pay

## 2019-10-25 VITALS — BP 146/85 | HR 77 | Temp 96.8°F | Resp 16 | Ht 65.0 in | Wt 222.4 lb

## 2019-10-25 DIAGNOSIS — N959 Unspecified menopausal and perimenopausal disorder: Secondary | ICD-10-CM | POA: Diagnosis not present

## 2019-10-25 DIAGNOSIS — N926 Irregular menstruation, unspecified: Secondary | ICD-10-CM | POA: Diagnosis not present

## 2019-10-25 NOTE — Progress Notes (Signed)
[facility] [place], [place] [address]  Internal MEDICINE  Office Visit Note  Patient Name: [name]  [id number]  [id number]  Date of Service: 11/18/2019  Chief Complaint  Patient presents with  . Follow-up  . Hypertension  . Quality Metric Gaps    covid-19 vaccine, TDAP, colonscopy    The patient is here for routine follow up.  - longer than normal menstrual cycles. They are lasting for nearly three weeks. Having hot flashes along with the long cycles. States that she is also getting headaches prior to onset of menstrual cycle. This has been going on for last three months.  -blood pressure is mildly elevated.       Current Medication: Outpatient Encounter Medications as of 10/25/2019  Medication Sig  . escitalopram (LEXAPRO) 5 MG tablet Take 2 tablets po qpm  . losartan (COZAAR) 50 MG tablet Take 1.5 tablets (75 mg total) by mouth daily.  . [DISCONTINUED] amoxicillin (AMOXIL) 875 MG tablet Take 1 tablet (875 mg total) by mouth 2 (two) times daily.  . [DISCONTINUED] nitrofurantoin, macrocrystal-monohydrate, (MACROBID) 100 MG capsule Take 1 capsule (100 mg total) by mouth 2 (two) times daily.  . [DISCONTINUED] omeprazole (PRILOSEC) 10 MG capsule Take 10 mg by mouth daily as needed.   No facility-administered encounter medications on file as of 10/25/2019.    Surgical History: Past Surgical History:  Procedure Laterality Date  . BREAST BIOPSY Left 2012   benign  . NO PAST SURGERIES      Medical History: Past Medical History:  Diagnosis Date  . Chest pain   . HTN (hypertension)   . Hypoglycemia   . Hypokalemia     Family History: Family History  Problem Relation Age of Onset  . Heart disease Mother   . Coronary artery disease Mother 110  . Heart failure Mother        chf  . Diabetes Mother   . Heart disease Father        Defibrillator  . Diabetes Father   . Diabetes Sister     Social History   Socioeconomic History  .  Marital status: Married    Spouse name: Not on file  . Number of children: 3  . Years of education: Not on file  . Highest education level: Not on file  Occupational History  . 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 Smoking status: Never Smoker  . Smokeless tobacco: Never Used  Substance and Sexual Activity  . Alcohol use: Yes    Alcohol/week: 1.0 standard drink    Types: 1 Glasses of wine per week    Comment: few times a week   . Drug use: No  . Sexual activity: Not on file  Other Topics Concern  . Not on file  Social History Narrative  . Not on file   Social Determinants of Health   Financial Resource Strain:   . Difficulty of Paying Living Expenses:   Food Insecurity:   . Worried About Charity fundraiser in the Last Year:   . Arboriculturist in the Last Year:   Transportation Needs:   . Film/video editor (Medical):   Marland Kitchen Lack of Transportation (Non-Medical):   Physical Activity:   . Days of Exercise per Week:   . Minutes of Exercise per Session:   Stress:   . Feeling of Stress :   Social  Connections:   . Frequency of Communication with Friends and Family:   . Frequency of Social Gatherings with Friends and Family:   . Attends Religious Services:   . Active Member of Clubs or Organizations:   . Attends Archivist Meetings:   Marland Kitchen Marital Status:   Intimate Partner Violence:   . Fear of Current or Ex-Partner:   . Emotionally Abused:   Marland Kitchen Physically Abused:   . Sexually Abused:       Review of Systems  Constitutional: Positive for fatigue. Negative for chills and unexpected weight change.  HENT: Negative for congestion, postnasal drip, rhinorrhea, sneezing and sore throat.   Respiratory: Negative for cough, chest tightness, shortness of breath and wheezing.   Cardiovascular: Negative for chest pain and palpitations.       Blood pressure is mildly elevated today.   Gastrointestinal:  Negative for abdominal pain, constipation, diarrhea, nausea and vomiting.  Endocrine: Negative for cold intolerance, heat intolerance, polydipsia and polyuria.  Genitourinary: Positive for menstrual problem and vaginal bleeding. Negative for dysuria, flank pain and pelvic pain.  Musculoskeletal: Negative for arthralgias, back pain, joint swelling and neck pain.  Skin: Negative for rash.  Allergic/Immunologic: Negative for environmental allergies.  Neurological: Negative for dizziness, tremors, numbness and headaches.  Hematological: Negative for adenopathy. Does not bruise/bleed easily.  Psychiatric/Behavioral: Positive for dysphoric mood. Negative for behavioral problems (Depression), sleep disturbance and suicidal ideas. The patient is not nervous/anxious.     Today's Vitals   10/25/19 0930  BP: (!) 146/85  Pulse: 77  Resp: 16  Temp: (!) 96.8 F (36 C)  SpO2: 95%  Weight: 222 lb 6.4 oz (100.9 kg)  Height: 5\' 5"  (1.651 m)   Body mass index is 37.01 kg/m.  Physical Exam Vitals and nursing note reviewed.  Constitutional:      General: She is not in acute distress.    Appearance: Normal appearance. She is well-developed. She is not diaphoretic.  HENT:     Head: Normocephalic and atraumatic.     Nose: Nose normal.     Mouth/Throat:     Pharynx: No oropharyngeal exudate.  Eyes:     Pupils: Pupils are equal, round, and reactive to light.  Neck:     Thyroid: No thyromegaly.     Vascular: No JVD.     Trachea: No tracheal deviation.  Cardiovascular:     Rate and Rhythm: Normal rate and regular rhythm.     Heart sounds: Normal heart sounds. No murmur heard.  No friction rub. No gallop.   Pulmonary:     Effort: Pulmonary effort is normal. No respiratory distress.     Breath sounds: Normal breath sounds. No wheezing or rales.  Chest:     Chest wall: No tenderness.  Abdominal:     General: Bowel sounds are normal.     Palpations: Abdomen is soft.     Tenderness: There is no  abdominal tenderness.  Musculoskeletal:        General: Normal range of motion.     Cervical back: Normal range of motion and neck supple.  Lymphadenopathy:     Cervical: No cervical adenopathy.  Skin:    General: Skin is warm and dry.  Neurological:     Mental Status: She is alert and oriented to person, place, and time.     Cranial Nerves: No cranial nerve deficit.  Psychiatric:        Mood and Affect: Mood normal.  Behavior: Behavior normal.        Thought Content: Thought content normal.        Judgment: Judgment normal.   Assessment/Plan: 1. Irregular periods/menstrual cycles Will get transvaginal pelvic ultrasound for further evaluation.  - US PELVIC COMPLETE WITH TRANSVAGINAL; Future  2. Unspecified menopausal and perimenopausal disorder Will check labs including reproductive hormones for further evaluation.   General Counseling: Gabriela verbalizes understanding of the findings of todays visit and agrees with plan of treatment. I have discussed any further diagnostic evaluation that may be needed or ordered today. We also reviewed her medications today. she has been encouraged to call the office with any questions or concerns that should arise related to todays visit.  This patient was seen by Leretha Pol FNP Collaboration with Dr Lavera Guise as a part of collaborative care agreement  Orders Placed This Encounter  Procedures  . US PELVIC COMPLETE WITH TRANSVAGINAL      Total time spent: 30 Minutes   Time spent includes review of chart, medications, test results, and follow up plan with the patient.      Dr Lavera Guise Internal medicine

## 2019-10-30 ENCOUNTER — Other Ambulatory Visit: Payer: Self-pay | Admitting: Nurse Practitioner

## 2019-10-30 ENCOUNTER — Other Ambulatory Visit: Payer: Self-pay

## 2019-10-30 ENCOUNTER — Ambulatory Visit
Admission: RE | Admit: 2019-10-30 | Discharge: 2019-10-30 | Disposition: A | Discharge status: Home / Self Care | Payer: No Typology Code available for payment source | Source: Ambulatory Visit | Attending: Nurse Practitioner | Admitting: Nurse Practitioner

## 2019-10-30 DIAGNOSIS — N926 Irregular menstruation, unspecified: Secondary | ICD-10-CM | POA: Insufficient documentation

## 2019-10-31 LAB — COMPREHENSIVE METABOLIC PANEL
ALT: 108 IU/L — ABNORMAL HIGH (ref 0–32)
AST: 108 IU/L — ABNORMAL HIGH (ref 0–40)
Albumin/Globulin Ratio: 1.5 (ref 1.2–2.2)
Albumin: 4.1 g/dL (ref 3.8–4.9)
Alkaline Phosphatase: 117 IU/L (ref 48–121)
BUN/Creatinine Ratio: 15 (ref 9–23)
BUN: 10 mg/dL (ref 6–24)
Bilirubin Total: 0.6 mg/dL (ref 0.0–1.2)
CO2: 23 mmol/L (ref 20–29)
Calcium: 8.4 mg/dL — ABNORMAL LOW (ref 8.7–10.2)
Chloride: 100 mmol/L (ref 96–106)
Creatinine, Ser: 0.66 mg/dL (ref 0.57–1.00)
GFR calc Af Amer: 116 mL/min/{1.73_m2} (ref >59)
GFR calc non Af Amer: 100 mL/min/{1.73_m2} (ref >59)
Globulin, Total: 2.7 g/dL (ref 1.5–4.5)
Glucose: 155 mg/dL — ABNORMAL HIGH (ref 65–99)
Potassium: 3.8 mmol/L (ref 3.5–5.2)
Sodium: 138 mmol/L (ref 134–144)
Total Protein: 6.8 g/dL (ref 6.0–8.5)

## 2019-10-31 LAB — CBC
Hematocrit: 40.8 % (ref 34.0–46.6)
Hemoglobin: 13.7 g/dL (ref 11.1–15.9)
MCH: 31 pg (ref 26.6–33.0)
MCHC: 33.6 g/dL (ref 31.5–35.7)
MCV: 92 fL (ref 79–97)
Platelets: 103 10*3/uL — ABNORMAL LOW (ref 150–450)
RBC: 4.42 x10E6/uL (ref 3.77–5.28)
RDW: 12.5 % (ref 11.7–15.4)
WBC: 8.8 10*3/uL (ref 3.4–10.8)

## 2019-10-31 LAB — LIPID PANEL WITH LDL/HDL RATIO
Cholesterol, Total: 192 mg/dL (ref 100–199)
HDL: 37 mg/dL — ABNORMAL LOW (ref >39)
LDL Chol Calc (NIH): 135 mg/dL — ABNORMAL HIGH (ref 0–99)
LDL/HDL Ratio: 3.6 ratio — ABNORMAL HIGH (ref 0.0–3.2)
Triglycerides: 112 mg/dL (ref 0–149)
VLDL Cholesterol Cal: 20 mg/dL (ref 5–40)

## 2019-10-31 LAB — FSH/LH
FSH: 28.5 m[IU]/mL
LH: 18.1 m[IU]/mL

## 2019-10-31 LAB — ESTRADIOL: Estradiol: 33.6 pg/mL

## 2019-10-31 LAB — VITAMIN D 25 HYDROXY (VIT D DEFICIENCY, FRACTURES): Vit D, 25-Hydroxy: 27.2 ng/mL — ABNORMAL LOW (ref 30.0–100.0)

## 2019-10-31 LAB — TSH: TSH: 1.64 u[IU]/mL (ref 0.450–4.500)

## 2019-10-31 LAB — T4, FREE: Free T4: 1.17 ng/dL (ref 0.82–1.77)

## 2019-11-01 NOTE — Progress Notes (Signed)
She will need to get HgbA1c at her visit and schedule abdominal ultrasound due to elevated liver functions. Follow up scheduled 11/09/2019

## 2019-11-09 ENCOUNTER — Telehealth: Payer: Self-pay

## 2019-11-09 ENCOUNTER — Ambulatory Visit: Payer: No Typology Code available for payment source | Admitting: Nurse Practitioner

## 2019-11-09 NOTE — Telephone Encounter (Signed)
Lmom to confirm and screen for 11-13-19 ov. 

## 2019-11-13 ENCOUNTER — Ambulatory Visit: Payer: No Typology Code available for payment source | Admitting: Nurse Practitioner

## 2019-11-13 ENCOUNTER — Other Ambulatory Visit: Payer: Self-pay

## 2019-11-13 ENCOUNTER — Encounter: Payer: Self-pay | Admitting: Nurse Practitioner

## 2019-11-13 VITALS — BP 148/80 | HR 82 | Temp 97.5°F | Resp 16 | Ht 65.0 in | Wt 222.0 lb

## 2019-11-13 DIAGNOSIS — R945 Abnormal results of liver function studies: Secondary | ICD-10-CM | POA: Insufficient documentation

## 2019-11-13 DIAGNOSIS — N926 Irregular menstruation, unspecified: Secondary | ICD-10-CM

## 2019-11-13 DIAGNOSIS — R7301 Impaired fasting glucose: Secondary | ICD-10-CM | POA: Diagnosis not present

## 2019-11-13 DIAGNOSIS — D259 Leiomyoma of uterus, unspecified: Secondary | ICD-10-CM | POA: Diagnosis not present

## 2019-11-13 DIAGNOSIS — D696 Thrombocytopenia, unspecified: Secondary | ICD-10-CM

## 2019-11-13 NOTE — Progress Notes (Signed)
[facility] [place], [place] [address]  Internal MEDICINE  Office Visit Note  Patient Name: [name]  [id number]  [id number]  Date of Service: 11/13/2019  Chief Complaint  Patient presents with  . Follow-up    review [place]  . Hypertension    The patient is here for follow up visit .the patient had been having  longer than normal menstrual cycles. They are lasting for nearly three weeks. This has been occurring for the last few months. A pelvic ultrasound was done prior to this visit. She has a 6cm uterine fibroid and endometrial thickness is consistent with perimenopause.  The patient also had routine fasting labs done.  -AST and ALT very elevated. Patient admits to drinking two to three glasses of wine approximately four days per week.  -glucose 155 -elevated LDL and LDL/HDL ratio 3.5.       Current Medication: Outpatient Encounter Medications as of 11/13/2019  Medication Sig  . escitalopram (LEXAPRO) 5 MG tablet Take 2 tablets po qpm  . losartan (COZAAR) 50 MG tablet Take 1.5 tablets (75 mg total) by mouth daily.   No facility-administered encounter medications on file as of 11/13/2019.    Surgical History: Past Surgical History:  Procedure Laterality Date  . BREAST BIOPSY Left 2012   benign  . NO PAST SURGERIES      Medical History: Past Medical History:  Diagnosis Date  . Chest pain   . HTN (hypertension)   . Hypoglycemia   . Hypokalemia     Family History: Family History  Problem Relation Age of Onset  . Heart disease Mother   . Coronary artery disease Mother 23  . Heart failure Mother        chf  . Diabetes Mother   . Heart disease Father        Defibrillator  . Diabetes Father   . Diabetes Sister     Social History   Socioeconomic History  . Marital status: Married    Spouse name: Not on file  . Number of children: 3  . Years of education: Not on file  . Highest education level: Not on file  Occupational History   . 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 Smoking status: Never Smoker  . Smokeless tobacco: Never Used  Substance and Sexual Activity  . Alcohol use: Yes    Alcohol/week: 1.0 standard drink    Types: 1 Glasses of wine per week    Comment: few times a week   . Drug use: No  . Sexual activity: Not on file  Other Topics Concern  . Not on file  Social History Narrative  . Not on file   Social Determinants of Health   Financial Resource Strain:   . Difficulty of Paying Living Expenses:   Food Insecurity:   . Worried About Charity fundraiser in the Last Year:   . Arboriculturist in the Last Year:   Transportation Needs:   . Film/video editor (Medical):   Marland Kitchen Lack of Transportation (Non-Medical):   Physical Activity:   . Days of Exercise per Week:   . Minutes of Exercise per Session:   Stress:   . Feeling of Stress :   Social Connections:   . Frequency of Communication with Friends and Family:   . Frequency of Social Gatherings with Friends and Family:   . Attends  Religious Services:   . Active Member of Clubs or Organizations:   . Attends Archivist Meetings:   Marland Kitchen Marital Status:   Intimate Partner Violence:   . Fear of Current or Ex-Partner:   . Emotionally Abused:   Marland Kitchen Physically Abused:   . Sexually Abused:       Review of Systems  Constitutional: Negative for activity change, chills, fatigue and unexpected weight change.  HENT: Negative for congestion, postnasal drip, rhinorrhea, sneezing and sore throat.   Respiratory: Negative for cough, chest tightness, shortness of breath and wheezing.   Cardiovascular: Negative for chest pain and palpitations.       Mildly elevated blood pressure upon arrival.   Gastrointestinal: Negative for abdominal pain, constipation, diarrhea, nausea and vomiting.  Endocrine: Negative for cold intolerance, heat intolerance, polydipsia and polyuria.   Musculoskeletal: Negative for arthralgias, back pain, joint swelling and neck pain.  Skin: Negative for rash.  Allergic/Immunologic: Negative for environmental allergies.  Neurological: Negative for dizziness, tremors, numbness and headaches.  Hematological: Negative for adenopathy. Does not bruise/bleed easily.  Psychiatric/Behavioral: Negative for behavioral problems (Depression), sleep disturbance and suicidal ideas. The patient is not nervous/anxious.     Today's Vitals   11/13/19 0919  BP: (!) 148/80  Pulse: 82  Resp: 16  Temp: (!) 97.5 F (36.4 C)  SpO2: 98%  Weight: 222 lb (100.7 kg)  Height: 5\' 5"  (1.651 m)   Body mass index is 36.94 kg/m.  Physical Exam Vitals and nursing note reviewed.  Constitutional:      General: She is not in acute distress.    Appearance: Normal appearance. She is well-developed. She is obese. She is not diaphoretic.  HENT:     Head: Normocephalic and atraumatic.     Mouth/Throat:     Pharynx: No oropharyngeal exudate.  Eyes:     Pupils: Pupils are equal, round, and reactive to light.  Neck:     Thyroid: No thyromegaly.     Vascular: No JVD.     Trachea: No tracheal deviation.  Cardiovascular:     Rate and Rhythm: Normal rate and regular rhythm.     Heart sounds: Normal heart sounds. No murmur heard.  No friction rub. No gallop.   Pulmonary:     Effort: Pulmonary effort is normal. No respiratory distress.     Breath sounds: Normal breath sounds. No wheezing or rales.  Chest:     Chest wall: No tenderness.  Abdominal:     General: Bowel sounds are normal.     Palpations: Abdomen is soft.     Tenderness: There is no abdominal tenderness.  Musculoskeletal:        General: Normal range of motion.     Cervical back: Normal range of motion and neck supple.  Lymphadenopathy:     Cervical: No cervical adenopathy.  Skin:    General: Skin is warm and dry.  Neurological:     Mental Status: She is alert and oriented to person, place, and  time.     Cranial Nerves: No cranial nerve deficit.  Psychiatric:        Mood and Affect: Mood normal.        Behavior: Behavior normal.        Thought Content: Thought content normal.        Judgment: Judgment normal.    Assessment/Plan: 1. Abnormal liver function Liver functions very elevated. Will check hepatic function panel along with acute hepatitis panel. Will get ultrasound of the  liver for further evolution.  - US Abdomen Complete; Future  2. Uterine leiomyoma, unspecified location Reviewed pelvic ultrasound with the patient. She has 6cm uterine fibroid present. Will refer to GYN for further evaluation.   3. Irregular periods/menstrual cycles Refer to GYN for further evaluation.  - Ambulatory referral to Gynecology  4. Impaired fasting glucose Check HgbA1c with repeat labs.   5. Thrombocytopenia ([place]) Recheck CBC  General Counseling: Dawnette verbalizes understanding of the findings of todays visit and agrees with plan of treatment. I have discussed any further diagnostic evaluation that may be needed or ordered today. We also reviewed her medications today. she has been encouraged to call the office with any questions or concerns that should arise related to todays visit.    Orders Placed This Encounter  Procedures  . US Abdomen Complete  . Ambulatory referral to Gynecology    This patient was seen by Leretha Pol FNP Collaboration with Dr Lavera Guise as a part of collaborative care agreement  Total time spent: 30 Minutes   Time spent includes review of chart, medications, test results, and follow up plan with the patient.      Dr Lavera Guise Internal medicine

## 2019-11-18 DIAGNOSIS — N959 Unspecified menopausal and perimenopausal disorder: Secondary | ICD-10-CM | POA: Insufficient documentation

## 2019-11-21 ENCOUNTER — Other Ambulatory Visit: Payer: Self-pay | Admitting: Nurse Practitioner

## 2019-11-22 LAB — CBC
Hematocrit: 41.9 % (ref 34.0–46.6)
Hemoglobin: 14.3 g/dL (ref 11.1–15.9)
MCH: 31.4 pg (ref 26.6–33.0)
MCHC: 34.1 g/dL (ref 31.5–35.7)
MCV: 92 fL (ref 79–97)
Platelets: 108 10*3/uL — ABNORMAL LOW (ref 150–450)
RBC: 4.55 x10E6/uL (ref 3.77–5.28)
RDW: 11.9 % (ref 11.7–15.4)
WBC: 9 10*3/uL (ref 3.4–10.8)

## 2019-11-22 LAB — COMPREHENSIVE METABOLIC PANEL
ALT: 64 IU/L — ABNORMAL HIGH (ref 0–32)
AST: 57 IU/L — ABNORMAL HIGH (ref 0–40)
Albumin/Globulin Ratio: 1.4 (ref 1.2–2.2)
Albumin: 4.1 g/dL (ref 3.8–4.9)
Alkaline Phosphatase: 122 IU/L — ABNORMAL HIGH (ref 48–121)
BUN/Creatinine Ratio: 15 (ref 9–23)
BUN: 9 mg/dL (ref 6–24)
Bilirubin Total: 0.4 mg/dL (ref 0.0–1.2)
CO2: 27 mmol/L (ref 20–29)
Calcium: 9.8 mg/dL (ref 8.7–10.2)
Chloride: 99 mmol/L (ref 96–106)
Creatinine, Ser: 0.62 mg/dL (ref 0.57–1.00)
GFR calc Af Amer: 118 mL/min/{1.73_m2} (ref >59)
GFR calc non Af Amer: 103 mL/min/{1.73_m2} (ref >59)
Globulin, Total: 2.9 g/dL (ref 1.5–4.5)
Glucose: 170 mg/dL — ABNORMAL HIGH (ref 65–99)
Potassium: 3.9 mmol/L (ref 3.5–5.2)
Sodium: 137 mmol/L (ref 134–144)
Total Protein: 7 g/dL (ref 6.0–8.5)

## 2019-11-22 LAB — HEPATITIS PANEL, ACUTE
Hep A IgM: NEGATIVE
Hep B C IgM: NEGATIVE
Hep C Virus Ab: <0.1 s/co ratio (ref 0.0–0.9)
Hepatitis B Surface Ag: NEGATIVE

## 2019-11-22 LAB — HEPATIC FUNCTION PANEL: Bilirubin, Direct: 0.12 mg/dL (ref 0.00–0.40)

## 2019-11-22 LAB — HGB A1C W/O EAG: Hgb A1c MFr Bld: 7.5 % — ABNORMAL HIGH (ref 4.8–5.6)

## 2019-12-14 ENCOUNTER — Ambulatory Visit: Payer: No Typology Code available for payment source

## 2019-12-14 DIAGNOSIS — R945 Abnormal results of liver function studies: Secondary | ICD-10-CM

## 2019-12-18 NOTE — Progress Notes (Unsigned)
U/s abdomen is reviewed, results will be dicussed on next visit

## 2019-12-25 ENCOUNTER — Other Ambulatory Visit: Payer: Self-pay

## 2019-12-25 DIAGNOSIS — I10 Essential (primary) hypertension: Secondary | ICD-10-CM

## 2019-12-25 MED ORDER — LOSARTAN POTASSIUM 50 MG PO TABS: 75.0000 mg | ORAL_TABLET | Freq: Every day | ORAL | 3 refills | Status: DC

## 2020-01-07 ENCOUNTER — Encounter: Payer: Self-pay | Admitting: Hospice and Palliative Medicine

## 2020-01-07 ENCOUNTER — Other Ambulatory Visit: Payer: Self-pay

## 2020-01-07 ENCOUNTER — Ambulatory Visit (INDEPENDENT_AMBULATORY_CARE_PROVIDER_SITE_OTHER): Payer: No Typology Code available for payment source | Admitting: Hospice and Palliative Medicine

## 2020-01-07 DIAGNOSIS — N926 Irregular menstruation, unspecified: Secondary | ICD-10-CM

## 2020-01-07 DIAGNOSIS — E119 Type 2 diabetes mellitus without complications: Secondary | ICD-10-CM

## 2020-01-07 DIAGNOSIS — R945 Abnormal results of liver function studies: Secondary | ICD-10-CM

## 2020-01-07 DIAGNOSIS — I10 Essential (primary) hypertension: Secondary | ICD-10-CM | POA: Diagnosis not present

## 2020-01-07 DIAGNOSIS — D696 Thrombocytopenia, unspecified: Secondary | ICD-10-CM

## 2020-01-07 DIAGNOSIS — R300 Dysuria: Secondary | ICD-10-CM

## 2020-01-07 LAB — POCT UA - MICROALBUMIN
Albumin/Creatinine Ratio, Urine, POC: 150
Creatinine, POC: 100 mg/dL
Microalbumin Ur, POC: >300 mg/L

## 2020-01-07 MED ORDER — ACCU-CHEK GUIDE VI STRP: ORAL_STRIP | 1 refills | Status: DC

## 2020-01-07 MED ORDER — ACCU-CHEK SOFTCLIX LANCETS MISC: 1 refills | Status: DC

## 2020-01-07 NOTE — Progress Notes (Signed)
[facility] [place], [place] [address]  Internal MEDICINE  Office Visit Note  Patient Name: [name]  [id number]  [id number]  Date of Service: 01/09/2020  Chief Complaint  Patient presents with  . Annual Exam    started cycle on sept 18th and its still on  . Hypertension  . Quality Metric Gaps    FLU, TETNAUS,COVID     HPI Pt is here for routine health maintenance examination She has had quite a bit going on recently 1. Irregular menstrual bleeding-She says she has been having abnormal menstrual bleeding for several months. She was seem by Dr. Levora Angel 09/01 and was started on Sprintec birth control. Birth control is not helping with her abnormal bleeding. She is currently having menstrual bleeding which started September 18th. She does have a know uterine fibroid not thought to be the cause of bleeding. 2. Impaired fasting glucose-elevated A1C-No history of diabetes 3. Elevated liver enzymes-Have improved since last visit-US reveals possible fatty liver, more echogenic than normal Hepatitis panel negative 4. Platelets-Levels have been low last few checks  She has significantly cut back on her drinking, in the last 2 weeks she has only had one alcoholic beverage--in the past was drinking about 2-3 glasses of wine about 4 nights per week She has been going to a weight loss clinic and receiving Hcg injections   Current Medication: Outpatient Encounter Medications as of 01/07/2020  Medication Sig  . escitalopram (LEXAPRO) 5 MG tablet Take 2 tablets po qpm  . losartan (COZAAR) 50 MG tablet Take 1.5 tablets (75 mg total) by mouth daily.  . norgestimate-ethinyl estradiol (ORTHO-CYCLEN) 0.25-35 MG-MCG tablet Take 0.25-35 tablets by mouth every morning.   No facility-administered encounter medications on file as of 01/07/2020.    Surgical History: Past Surgical History:  Procedure Laterality Date  . BREAST BIOPSY Left 2012   benign  . NO PAST  SURGERIES      Medical History: Past Medical History:  Diagnosis Date  . Chest pain   . HTN (hypertension)   . Hypoglycemia   . Hypokalemia     Family History: Family History  Problem Relation Age of Onset  . Heart disease Mother   . Coronary artery disease Mother 45  . Heart failure Mother        chf  . Diabetes Mother   . Heart disease Father        Defibrillator  . Diabetes Father   . Diabetes Sister     Review of Systems  Constitutional: Negative for chills, diaphoresis and fatigue.  HENT: Negative for ear pain, postnasal drip and sinus pressure.   Eyes: Negative for photophobia, discharge, redness, itching and visual disturbance.  Respiratory: Negative for cough, shortness of breath and wheezing.   Cardiovascular: Negative for chest pain, palpitations and leg swelling.  Gastrointestinal: Negative for abdominal pain, constipation, diarrhea, nausea and vomiting.  Endocrine: Negative for polydipsia, polyphagia and polyuria.  Genitourinary: Positive for menstrual problem and vaginal bleeding. Negative for dysuria and flank pain.  Musculoskeletal: Negative for arthralgias, back pain, gait problem and neck pain.  Skin: Negative for color change.  Allergic/Immunologic: Negative for environmental allergies and food allergies.  Neurological: Negative for dizziness and headaches.  Hematological: Does not bruise/bleed easily.  Psychiatric/Behavioral: Negative for agitation, behavioral problems (depression) and hallucinations.     Vital Signs: BP (!) 150/77   Pulse 83   Temp (!) 97.5 F (36.4 C)   Resp 16   Ht $R'5\' 5"'dE$  (  1.651 m)   Wt 220 lb (99.8 kg)   SpO2 97%   BMI 36.61 kg/m    Physical Exam Vitals reviewed.  Constitutional:      Appearance: She is obese.  Cardiovascular:     Rate and Rhythm: Normal rate and regular rhythm.     Pulses: Normal pulses.     Heart sounds: Normal heart sounds.  Pulmonary:     Effort: Pulmonary effort is normal.     Breath  sounds: Normal breath sounds.  Abdominal:     General: Abdomen is flat.     Palpations: Abdomen is soft.  Musculoskeletal:        General: Normal range of motion.     Cervical back: Normal range of motion.  Skin:    General: Skin is warm.  Neurological:     General: No focal deficit present.     Mental Status: She is alert and oriented to person, place, and time. Mental status is at baseline.  Psychiatric:        Mood and Affect: Mood normal.        Behavior: Behavior normal.        Thought Content: Thought content normal.      LABS: Recent Results (from the past 2160 hour(s))  Comprehensive metabolic panel     Status: Abnormal   Collection Time: 10/30/19 10:10 AM  Result Value Ref Range   Glucose 155 (H) 65 - 99 mg/dL   BUN 10 6 - 24 mg/dL   Creatinine, Ser 0.66 0.57 - 1.00 mg/dL   GFR calc non Af Amer 100 >59 mL/min/1.73   GFR calc Af Amer 116 >59 mL/min/1.73    Comment: **[facility] currently reports eGFR in compliance with the current**   recommendations of the [employer]. [facility] will   update reporting as new guidelines are published from the NKF-ASN   Task force.    BUN/Creatinine Ratio 15 9 - 23   Sodium 138 134 - 144 mmol/L   Potassium 3.8 3.5 - 5.2 mmol/L   Chloride 100 96 - 106 mmol/L   CO2 23 20 - 29 mmol/L   Calcium 8.4 (L) 8.7 - 10.2 mg/dL   Total Protein 6.8 6.0 - 8.5 g/dL   Albumin 4.1 3.8 - 4.9 g/dL   Globulin, Total 2.7 1.5 - 4.5 g/dL   Albumin/Globulin Ratio 1.5 1.2 - 2.2   Bilirubin Total 0.6 0.0 - 1.2 mg/dL   Alkaline Phosphatase 117 48 - 121 IU/L   AST 108 (H) 0 - 40 IU/L   ALT 108 (H) 0 - 32 IU/L  CBC     Status: Abnormal   Collection Time: 10/30/19 10:10 AM  Result Value Ref Range   WBC 8.8 3.4 - 10.8 x10E3/uL   RBC 4.42 3.77 - 5.28 x10E6/uL   Hemoglobin 13.7 11.1 - 15.9 g/dL   Hematocrit 40.8 34.0 - 46.6 %   MCV 92 79 - 97 fL   MCH 31.0 26.6 - 33.0 pg   MCHC 33.6 31 - 35 g/dL   RDW 12.5 11.7 - 15.4 %   Platelets 103 (L)  150 - 450 x10E3/uL    Comment: Actual platelet count may be somewhat higher than reported due to aggregation of platelets in this sample.    Hematology Comments: Note:     Comment: Verified by microscopic examination.  Lipid Panel With LDL/HDL Ratio     Status: Abnormal   Collection Time: 10/30/19 10:10 AM  Result Value Ref Range  Cholesterol, Total 192 100 - 199 mg/dL   Triglycerides 112 0 - 149 mg/dL   HDL 37 (L) >39 mg/dL   VLDL Cholesterol Cal 20 5 - 40 mg/dL   LDL Chol Calc (NIH) 135 (H) 0 - 99 mg/dL   LDL/HDL Ratio 3.6 (H) 0.0 - 3.2 ratio    Comment:                                     LDL/HDL Ratio                                             Men  Women                               1/2 Avg.Risk  1.0    1.5                                   Avg.Risk  3.6    3.2                                2X Avg.Risk  6.2    5.0                                3X Avg.Risk  8.0    6.1   FSH/LH     Status: None   Collection Time: 10/30/19 10:10 AM  Result Value Ref Range   LH 18.1 mIU/mL    Comment:                     Adult Female:                       Follicular phase      2.4 -  12.6                       Ovulation phase      14.0 -  95.6                       Luteal phase          1.0 -  11.4                       Postmenopausal        7.7 -  58.5    FSH 28.5 mIU/mL    Comment:                     Adult Female:                       Follicular phase      3.5 -  12.5                       Ovulation phase       4.7 -  21.5  Luteal phase          1.7 -   7.7                       Postmenopausal       25.8 - 134.8   T4, free     Status: None   Collection Time: 10/30/19 10:10 AM  Result Value Ref Range   Free T4 1.17 0.82 - 1.77 ng/dL  TSH     Status: None   Collection Time: 10/30/19 10:10 AM  Result Value Ref Range   TSH 1.640 0.450 - 4.500 uIU/mL  Estradiol     Status: None   Collection Time: 10/30/19 10:10 AM  Result Value Ref Range   Estradiol 33.6  pg/mL    Comment:                     Adult Female:                       Follicular phase   29.5 -   166.0                       Ovulation phase    85.8 -   498.0                       Luteal phase       43.8 -   211.0                       Postmenopausal     <6.0 -    54.7                     Pregnancy                       1st trimester     215.0 - >4300.0 Roche ECLIA methodology   VITAMIN D 25 Hydroxy (Vit-D Deficiency, Fractures)     Status: Abnormal   Collection Time: 10/30/19 10:10 AM  Result Value Ref Range   Vit D, 25-Hydroxy 27.2 (L) 30.0 - 100.0 ng/mL    Comment: Vitamin D deficiency has been defined by the [employer] and an Endocrine Society practice guideline as a level of serum 25-OH vitamin D less than 20 ng/mL (1,2). The Endocrine Society went on to further define vitamin D insufficiency as a level between 21 and 29 ng/mL (2). 1. IOM (Institute of Medicine). 2010. Dietary reference    intakes for calcium and D. [place]: The    [employer]. 2. Holick MF, Binkley [place], Bischoff-Ferrari HA, et al.    Evaluation, treatment, and prevention of vitamin D    deficiency: an Endocrine Society clinical practice    guideline. JCEM. 2011 Jul; 96(7):1911-30.   Comprehensive metabolic panel     Status: Abnormal   Collection Time: 11/21/19  1:47 PM  Result Value Ref Range   Glucose 170 (H) 65 - 99 mg/dL   BUN 9 6 - 24 mg/dL   Creatinine, Ser 0.62 0.57 - 1.00 mg/dL   GFR calc non Af Amer 103 >59 mL/min/1.73   GFR calc Af Amer 118 >59 mL/min/1.73    Comment: **[facility] currently reports eGFR in compliance with the current**   recommendations of the [employer]. [facility] will   update reporting as new guidelines are published from  the NKF-ASN   Task force.    BUN/Creatinine Ratio 15 9 - 23   Sodium 137 134 - 144 mmol/L   Potassium 3.9 3.5 - 5.2 mmol/L   Chloride 99 96 - 106 mmol/L   CO2 27 20 - 29 mmol/L   Calcium 9.8 8.7 - 10.2 mg/dL    Total Protein 7.0 6.0 - 8.5 g/dL   Albumin 4.1 3.8 - 4.9 g/dL   Globulin, Total 2.9 1.5 - 4.5 g/dL   Albumin/Globulin Ratio 1.4 1.2 - 2.2   Bilirubin Total 0.4 0.0 - 1.2 mg/dL   Alkaline Phosphatase 122 (H) 48 - 121 IU/L   AST 57 (H) 0 - 40 IU/L   ALT 64 (H) 0 - 32 IU/L  CBC     Status: Abnormal   Collection Time: 11/21/19  1:47 PM  Result Value Ref Range   WBC 9.0 3.4 - 10.8 x10E3/uL   RBC 4.55 3.77 - 5.28 x10E6/uL   Hemoglobin 14.3 11.1 - 15.9 g/dL   Hematocrit 41.9 34.0 - 46.6 %   MCV 92 79 - 97 fL   MCH 31.4 26.6 - 33.0 pg   MCHC 34.1 31 - 35 g/dL   RDW 11.9 11.7 - 15.4 %   Platelets 108 (L) 150 - 450 x10E3/uL    Comment: Actual platelet count may be somewhat higher than reported due to aggregation of platelets in this sample.    Hematology Comments: Note:     Comment: Verified by microscopic examination.  Hepatic function panel     Status: None   Collection Time: 11/21/19  1:47 PM  Result Value Ref Range   Bilirubin, Direct 0.12 0.00 - 0.40 mg/dL  Hepatitis panel, acute     Status: None   Collection Time: 11/21/19  1:47 PM  Result Value Ref Range   Hep A IgM Negative Negative   Hepatitis B Surface Ag Negative Negative   Hep B C IgM Negative Negative   Hep C Virus Ab <0.1 0.0 - 0.9 s/co ratio    Comment:                                   Negative:     < 0.8                              Indeterminate: 0.8 - 0.9                                   Positive:     > 0.9  The CDC recommends that a positive HCV antibody result  be followed up with a HCV Nucleic Acid Amplification  test (737106).   Hgb A1c w/o eAG     Status: Abnormal   Collection Time: 11/21/19  1:47 PM  Result Value Ref Range   Hgb A1c MFr Bld 7.5 (H) 4.8 - 5.6 %    Comment:          Prediabetes: 5.7 - 6.4          Diabetes: >6.4          Glycemic control for adults with diabetes: <7.0   POCT UA - Microalbumin     Status: None   Collection Time: 01/07/20 12:29 PM  Result Value Ref Range    Microalbumin Ur, POC >  300 mg/L   Creatinine, POC 100 mg/dL   Albumin/Creatinine Ratio, Urine, POC 150   UA/M w/rflx Culture, Routine     Status: Abnormal   Collection Time: 01/07/20 12:30 PM   Specimen: Urine   Urine  Result Value Ref Range   Specific Gravity, UA 1.021 1.005 - 1.030   pH, UA 6.0 5.0 - 7.5   Color, UA Yellow Yellow   Appearance Ur Cloudy (A) Clear   Leukocytes,UA Negative Negative   Protein,UA 1+ (A) Negative/Trace   Glucose, UA Negative Negative   Ketones, UA Negative Negative   RBC, UA 2+ (A) Negative   Bilirubin, UA Negative Negative   Urobilinogen, Ur 0.2 0.2 - 1.0 mg/dL   Nitrite, UA Negative Negative   Microscopic Examination See below:     Comment: Microscopic was indicated and was performed.   Urinalysis Reflex Comment     Comment: This specimen will not reflex to a Urine Culture.  Microscopic Examination     Status: Abnormal   Collection Time: 01/07/20 12:30 PM   Urine  Result Value Ref Range   WBC, UA 0-5 0 - 5 /hpf   RBC >30 (A) 0 - 2 /hpf   Epithelial Cells (non renal) 0-10 0 - 10 /hpf   Casts None seen None seen /lpf   Bacteria, UA None seen None seen/Few    Assessment/Plan: 1. Irregular periods/menstrual cycles Advised to be seen again by GYN as she continues to bleed heavily after starting birth control therapy.  2. Diabetes mellitus, new onset (HCC) Hands on instructions given on how to use glucometer, meter kit given to her in office today--advised to check her glucose levels at least daily Samples of 5 mg Farxiga given as well as 10 mg--take 5 mg for 1 week and then start with 10 mg--will follow-up in 7-10 days Discussed lifestyle modifications--will make small changes at each follow-up--discussed from today until next follow-up to avoid alcohol especially wine due to high sugar content - POCT UA - Microalbumin  3. Essential hypertension BP slightly elevated today, will need to monitor and adjust therapy as needed.  4. Thrombocytopenia  (HCC) Will recheck platelet levels--may need referral to hematology if levels continue to be low -CBC w/Diff/Platelet  5. Abnormal liver function Will recheck liver enzymes--advised to avoid alcohol or limit intake until she has her labs drawn US liver possible fatty liver disease--may need GI referral -Comprehensive Metabolic Panel (CMET)  6. Dysuria - UA/M w/rflx Culture, Routine - Microscopic Examination   General Counseling: Willowdean verbalizes understanding of the findings of todays visit and agrees with plan of treatment. I have discussed any further diagnostic evaluation that may be needed or ordered today. We also reviewed her medications today. she has been encouraged to call the office with any questions or concerns that should arise related to todays visit.    Counseling: Hypertension Counseling:   The following hypertensive lifestyle modification were recommended and discussed:  1. Limiting alcohol intake to less than 1 oz/day of ethanol:(24 oz of beer or 8 oz of wine or 2 oz of 100-proof whiskey). 2. Take baby ASA 81 mg daily. 3. Importance of regular aerobic exercise and losing weight. 4. Reduce dietary saturated fat and cholesterol intake for overall cardiovascular health. 5. Maintaining adequate dietary potassium, calcium, and magnesium intake. 6. Regular monitoring of the blood pressure. 7. Reduce sodium intake to less than 100 mmol/day (less than 2.3 gm of sodium or less than 6 gm of sodium choride)   Orders  Placed This Encounter  Procedures  . Microscopic Examination  . UA/M w/rflx Culture, Routine  . Comprehensive Metabolic Panel (CMET)  . CBC w/Diff/Platelet  . POCT UA - Microalbumin   Total time spent: 45 Minutes  Time spent includes review of chart, medications, test results, and follow up plan with the patient.   This patient was seen by Casey Burkitt AGNP-C Collaboration with Dr Lavera Guise as a part of collaborative care agreement   Tanna Furry. [facility] Internal Medicine

## 2020-01-09 ENCOUNTER — Encounter: Payer: Self-pay | Admitting: Hospice and Palliative Medicine

## 2020-01-11 ENCOUNTER — Other Ambulatory Visit: Payer: Self-pay

## 2020-01-11 DIAGNOSIS — F320 Major depressive disorder, single episode, mild: Secondary | ICD-10-CM

## 2020-01-11 LAB — UA/M W/RFLX CULTURE, ROUTINE
Bilirubin, UA: NEGATIVE
Glucose, UA: NEGATIVE
Ketones, UA: NEGATIVE
Leukocytes,UA: NEGATIVE
Nitrite, UA: NEGATIVE
Specific Gravity, UA: 1.021 (ref 1.005–1.030)
Urobilinogen, Ur: 0.2 mg/dL (ref 0.2–1.0)
pH, UA: 6 (ref 5.0–7.5)

## 2020-01-11 LAB — MICROSCOPIC EXAMINATION
Bacteria, UA: NONE SEEN
Casts: NONE SEEN /lpf
RBC, Urine: >30 /hpf — AB (ref 0–2)

## 2020-01-11 MED ORDER — ESCITALOPRAM OXALATE 5 MG PO TABS: ORAL_TABLET | ORAL | 3 refills | Status: DC

## 2020-01-13 NOTE — Progress Notes (Signed)
New diagnosis of diabetes. Discuss at visit 10/14

## 2020-01-17 ENCOUNTER — Other Ambulatory Visit: Payer: Self-pay

## 2020-01-17 ENCOUNTER — Encounter: Payer: Self-pay | Admitting: Nurse Practitioner

## 2020-01-17 ENCOUNTER — Encounter (INDEPENDENT_AMBULATORY_CARE_PROVIDER_SITE_OTHER): Payer: Self-pay

## 2020-01-17 ENCOUNTER — Ambulatory Visit: Payer: No Typology Code available for payment source | Admitting: Nurse Practitioner

## 2020-01-17 VITALS — BP 140/70 | HR 88 | Resp 16 | Ht 65.0 in | Wt 221.0 lb

## 2020-01-17 DIAGNOSIS — N921 Excessive and frequent menstruation with irregular cycle: Secondary | ICD-10-CM | POA: Insufficient documentation

## 2020-01-17 DIAGNOSIS — I10 Essential (primary) hypertension: Secondary | ICD-10-CM | POA: Diagnosis not present

## 2020-01-17 DIAGNOSIS — E1165 Type 2 diabetes mellitus with hyperglycemia: Secondary | ICD-10-CM | POA: Diagnosis not present

## 2020-01-17 DIAGNOSIS — N938 Other specified abnormal uterine and vaginal bleeding: Secondary | ICD-10-CM | POA: Insufficient documentation

## 2020-01-17 DIAGNOSIS — K8020 Calculus of gallbladder without cholecystitis without obstruction: Secondary | ICD-10-CM

## 2020-01-17 DIAGNOSIS — D696 Thrombocytopenia, unspecified: Secondary | ICD-10-CM | POA: Diagnosis not present

## 2020-01-17 LAB — CBC WITH DIFFERENTIAL/PLATELET
Basophils Absolute: 0.1 10*3/uL (ref 0.0–0.2)
Basos: 0 %
EOS (ABSOLUTE): 0.3 10*3/uL (ref 0.0–0.4)
Eos: 3 %
Hematocrit: 35.1 % (ref 34.0–46.6)
Hemoglobin: 11.9 g/dL (ref 11.1–15.9)
Immature Grans (Abs): 0 10*3/uL (ref 0.0–0.1)
Immature Granulocytes: 0 %
Lymphocytes Absolute: 3 10*3/uL (ref 0.7–3.1)
Lymphs: 26 %
MCH: 31.5 pg (ref 26.6–33.0)
MCHC: 33.9 g/dL (ref 31.5–35.7)
MCV: 93 fL (ref 79–97)
Monocytes Absolute: 0.5 10*3/uL (ref 0.1–0.9)
Monocytes: 4 %
Neutrophils Absolute: 7.6 10*3/uL — ABNORMAL HIGH (ref 1.4–7.0)
Neutrophils: 67 %
Platelets: 182 10*3/uL (ref 150–450)
RBC: 3.78 x10E6/uL (ref 3.77–5.28)
RDW: 12.3 % (ref 11.7–15.4)
WBC: 11.5 10*3/uL — ABNORMAL HIGH (ref 3.4–10.8)

## 2020-01-17 LAB — COMPREHENSIVE METABOLIC PANEL
ALT: 44 IU/L — ABNORMAL HIGH (ref 0–32)
AST: 61 IU/L — ABNORMAL HIGH (ref 0–40)
Albumin/Globulin Ratio: 1.4 (ref 1.2–2.2)
Albumin: 3.7 g/dL — ABNORMAL LOW (ref 3.8–4.9)
Alkaline Phosphatase: 124 IU/L — ABNORMAL HIGH (ref 44–121)
BUN/Creatinine Ratio: 18 (ref 9–23)
BUN: 12 mg/dL (ref 6–24)
Bilirubin Total: 0.4 mg/dL (ref 0.0–1.2)
CO2: 21 mmol/L (ref 20–29)
Calcium: 8.3 mg/dL — ABNORMAL LOW (ref 8.7–10.2)
Chloride: 104 mmol/L (ref 96–106)
Creatinine, Ser: 0.67 mg/dL (ref 0.57–1.00)
GFR calc Af Amer: 115 mL/min/{1.73_m2} (ref >59)
GFR calc non Af Amer: 100 mL/min/{1.73_m2} (ref >59)
Globulin, Total: 2.6 g/dL (ref 1.5–4.5)
Glucose: 124 mg/dL — ABNORMAL HIGH (ref 65–99)
Potassium: 3.9 mmol/L (ref 3.5–5.2)
Sodium: 139 mmol/L (ref 134–144)
Total Protein: 6.3 g/dL (ref 6.0–8.5)

## 2020-01-17 MED ORDER — DAPAGLIFLOZIN PROPANEDIOL 5 MG PO TABS: 5.0000 mg | ORAL_TABLET | Freq: Every day | ORAL | 3 refills | Status: DC

## 2020-01-17 NOTE — Progress Notes (Signed)
[facility] [place],  [address]  Internal MEDICINE  Office Visit Note  Patient Name: [name]  [id number]  [id number]  Date of Service: 02/03/2020  Chief Complaint  Patient presents with  . Follow-up  . Hypertension  . controlled substance policy    Reviewed    The patient is here for follow up. She was started on Farxiga 5mg  daily. She is tolerating this medication well with no negative side effects. She has been checking her blood sugars daily. Her log is 10/5 - 134 10/6 - 144 10/7- 137 10/8 - 141 10/9 - 127 10/10 - 127 10/11 - 133 10/12 - 140 10/13 - 130  10/14 - 115  She states that she has joined an exercise class. Going to class three evenings per week. Has not had any alcohol for past three to four weeks.  Labs done 10/5 indicate platelets have returned to normal. Her liver functions are still moderately elevated, however, they continue to improve from initial check back in July, 2021. She denies abdominal pain, nausea, vomiting, or diarrhea. Ultrasound of the abdomen did indicate fatty liver. Multiple gallstones were also seen. Continues to have menstrual bleeding. States that she started on Sprintec per her GYN provider on 9/1. She started bleeding 9/18 and continues to have bleeding. She sees her GYN provider later today.       Current Medication: Outpatient Encounter Medications as of 01/17/2020  Medication Sig  . Accu-Chek Softclix Lancets lancets Use as instructed one a day e11.65  . escitalopram (LEXAPRO) 5 MG tablet Take 2 tablets po qpm  . glucose blood (ACCU-CHEK GUIDE) test strip Use as instructed Once a day dx e11.65  . losartan (COZAAR) 50 MG tablet Take 1.5 tablets (75 mg total) by mouth daily.  . norgestimate-ethinyl estradiol (ORTHO-CYCLEN) 0.25-35 MG-MCG tablet Take 0.25-35 tablets by mouth every morning.  . dapagliflozin propanediol (FARXIGA) 5 MG TABS tablet Take 1 tablet (5 mg total) by mouth daily.   No  facility-administered encounter medications on file as of 01/17/2020.    Surgical History: Past Surgical History:  Procedure Laterality Date  . BREAST BIOPSY Left 2012   benign  . NO PAST SURGERIES      Medical History: Past Medical History:  Diagnosis Date  . Chest pain   . HTN (hypertension)   . Hypoglycemia   . Hypokalemia     Family History: Family History  Problem Relation Age of Onset  . Heart disease Mother   . Coronary artery disease Mother 69  . Heart failure Mother        chf  . Diabetes Mother   . Heart disease Father        Defibrillator  . Diabetes Father   . Diabetes Sister     Social History   Socioeconomic History  . Marital status: Married    Spouse name: Not on file  . Number of children: 3  . Years of education: Not on file  . Highest education level: Not on file  Occupational History  . 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 Smoking status: Never Smoker  . Smokeless tobacco: Never Used  Substance and Sexual Activity  . Alcohol use: Yes    Alcohol/week: 1.0 standard drink    Types: 1 Glasses of wine per week    Comment: few times a week   . Drug use: No  .  Sexual activity: Not on file  Other Topics Concern  . Not on file  Social History Narrative  . Not on file   Social Determinants of Health   Financial Resource Strain:   . Difficulty of Paying Living Expenses: Not on file  Food Insecurity:   . Worried About Charity fundraiser in the Last Year: Not on file  . Ran Out of Food in the Last Year: Not on file  Transportation Needs:   . Lack of Transportation (Medical): Not on file  . Lack of Transportation (Non-Medical): Not on file  Physical Activity:   . Days of Exercise per Week: Not on file  . Minutes of Exercise per Session: Not on file  Stress:   . Feeling of Stress : Not on file  Social Connections:   . Frequency of Communication with Friends and Family:  Not on file  . Frequency of Social Gatherings with Friends and Family: Not on file  . Attends Religious Services: Not on file  . Active Member of Clubs or Organizations: Not on file  . Attends Archivist Meetings: Not on file  . Marital Status: Not on file  Intimate Partner Violence:   . Fear of Current or Ex-Partner: Not on file  . Emotionally Abused: Not on file  . Physically Abused: Not on file  . Sexually Abused: Not on file      Review of Systems  Constitutional: Negative for activity change, chills, fatigue and unexpected weight change.  HENT: Negative for congestion, postnasal drip, rhinorrhea, sneezing and sore throat.   Respiratory: Negative for cough, chest tightness, shortness of breath and wheezing.   Cardiovascular: Negative for chest pain and palpitations.  Gastrointestinal: Negative for abdominal pain, constipation, diarrhea, nausea and vomiting.  Endocrine: Negative for cold intolerance, heat intolerance, polydipsia and polyuria.       Blood sugar log indicates that she is responding well to farxiga.   Musculoskeletal: Negative for arthralgias, back pain, joint swelling and neck pain.  Skin: Negative for rash.  Allergic/Immunologic: Negative for environmental allergies.  Neurological: Negative for dizziness, tremors, numbness and headaches.  Hematological: Negative for adenopathy. Does not bruise/bleed easily.  Psychiatric/Behavioral: Negative for behavioral problems (Depression), sleep disturbance and suicidal ideas. The patient is not nervous/anxious.    Today's Vitals   01/17/20 1006  BP: 140/70  Pulse: 88  Resp: 16  SpO2: 96%  Weight: 221 lb (100.2 kg)  Height: 5\' 5"  (1.651 m)   Body mass index is 36.78 kg/m.  Physical Exam Vitals and nursing note reviewed.  Constitutional:      General: She is not in acute distress.    Appearance: Normal appearance. She is well-developed. She is obese. She is not diaphoretic.  HENT:     Head:  Normocephalic and atraumatic.     Nose: Nose normal.     Mouth/Throat:     Pharynx: No oropharyngeal exudate.  Eyes:     Pupils: Pupils are equal, round, and reactive to light.  Neck:     Thyroid: No thyromegaly.     Vascular: No carotid bruit or JVD.     Trachea: No tracheal deviation.  Cardiovascular:     Rate and Rhythm: Normal rate and regular rhythm.     Heart sounds: Normal heart sounds. No murmur heard.  No friction rub. No gallop.   Pulmonary:     Effort: Pulmonary effort is normal. No respiratory distress.     Breath sounds: Normal breath sounds. No wheezing or  rales.  Chest:     Chest wall: No tenderness.  Abdominal:     General: Bowel sounds are normal.     Palpations: Abdomen is soft.     Tenderness: There is abdominal tenderness.     Comments: Mild tenderness of the abdomen with medium palpation of the abdomen.   Musculoskeletal:        General: Normal range of motion.     Cervical back: Normal range of motion and neck supple.  Lymphadenopathy:     Cervical: No cervical adenopathy.  Skin:    General: Skin is warm and dry.  Neurological:     Mental Status: She is alert and oriented to person, place, and time.     Cranial Nerves: No cranial nerve deficit.  Psychiatric:        Mood and Affect: Mood normal.        Behavior: Behavior normal.        Thought Content: Thought content normal.        Judgment: Judgment normal.   Assessment/Plan: 1. Type 2 diabetes mellitus with hyperglycemia, without long-term current use of insulin (HCC) Blood sugars running in low to mid 100s. Will continue with farxiga 5mg  tablets daily. She should monitor blood sugars daily and bring log with her to next visit.  - dapagliflozin propanediol (FARXIGA) 5 MG TABS tablet; Take 1 tablet (5 mg total) by mouth daily.  Dispense: 30 tablet; Refill: 3  2. Essential hypertension Stable. Continue bp medication as prescribed   3. Calculus of gallbladder without cholecystitis without  obstruction Reviewed abdominal ultrasound with the patient showing fatty liver and multiple gallstones in gallbladder. Will refer to surgery for further evaluation.  - Ambulatory referral to General Surgery  4. Thrombocytopenia ([place]) Resolved. Will continue to monitor.   General Counseling: Vona verbalizes understanding of the findings of todays visit and agrees with plan of treatment. I have discussed any further diagnostic evaluation that may be needed or ordered today. We also reviewed her medications today. she has been encouraged to call the office with any questions or concerns that should arise related to todays visit.  Diabetes Counseling:  1. Addition of ACE inh/ ARB'S for nephroprotection. Microalbumin is updated  2. Diabetic foot care, prevention of complications. Podiatry consult 3. Exercise and lose weight.  4. Diabetic eye examination, Diabetic eye exam is updated  5. Monitor blood sugar closlely. nutrition counseling.  6. Sign and symptoms of hypoglycemia including shaking sweating,confusion and headaches.  This patient was seen by Leretha Pol FNP Collaboration with Dr Lavera Guise as a part of collaborative care agreement  Orders Placed This Encounter  Procedures  . Ambulatory referral to General Surgery    Meds ordered this encounter  Medications  . dapagliflozin propanediol (FARXIGA) 5 MG TABS tablet    Sig: Take 1 tablet (5 mg total) by mouth daily.    Dispense:  30 tablet    Refill:  3    Samples provided today.    Order Specific Question:   Supervising Provider    Answer:   Lavera Guise [[id number]]    Total time spent: 30 Minutes   Time spent includes review of chart, medications, test results, and follow up plan with the patient.      Dr Lavera Guise Internal medicine

## 2020-02-03 DIAGNOSIS — K8020 Calculus of gallbladder without cholecystitis without obstruction: Secondary | ICD-10-CM | POA: Insufficient documentation

## 2020-02-03 DIAGNOSIS — E1165 Type 2 diabetes mellitus with hyperglycemia: Secondary | ICD-10-CM | POA: Insufficient documentation

## 2020-02-14 ENCOUNTER — Telehealth: Payer: Self-pay

## 2020-02-15 NOTE — Telephone Encounter (Signed)
WE CAN DO IT VISIT

## 2020-02-19 ENCOUNTER — Encounter: Payer: Self-pay | Admitting: Nurse Practitioner

## 2020-02-19 ENCOUNTER — Other Ambulatory Visit: Payer: Self-pay

## 2020-02-19 ENCOUNTER — Ambulatory Visit: Payer: No Typology Code available for payment source | Admitting: Nurse Practitioner

## 2020-02-19 ENCOUNTER — Other Ambulatory Visit: Payer: Self-pay | Admitting: Nurse Practitioner

## 2020-02-19 VITALS — BP 142/80 | HR 87 | Temp 98.9°F | Resp 16 | Ht 65.0 in | Wt 214.8 lb

## 2020-02-19 DIAGNOSIS — E1165 Type 2 diabetes mellitus with hyperglycemia: Secondary | ICD-10-CM

## 2020-02-19 DIAGNOSIS — D259 Leiomyoma of uterus, unspecified: Secondary | ICD-10-CM

## 2020-02-19 DIAGNOSIS — I10 Essential (primary) hypertension: Secondary | ICD-10-CM

## 2020-02-19 DIAGNOSIS — Z01818 Encounter for other preprocedural examination: Secondary | ICD-10-CM | POA: Diagnosis not present

## 2020-02-19 LAB — POCT GLYCOSYLATED HEMOGLOBIN (HGB A1C): Hemoglobin A1C: 7.1 % — AB (ref 4.0–5.6)

## 2020-02-19 NOTE — Progress Notes (Signed)
[facility] [place], [place] [address]  Internal MEDICINE  Office Visit Note  Patient Name: [name]  [id number]  [id number]  Date of Service: 03/16/2020  Chief Complaint  Patient presents with  . Follow-up  . Hypertension  . Diabetes  . controlled substance form    The patient is here for follow up visit. She was recently diagnosed with type 2 diabetes. Her initial HgbA1c was 7.5. was started on Farxiga 5mg  at that visit. Her blood sugars have improved. Her HgbA1c is 7.1 today. She has seen GYN due to uterine fibroid tumor, measuring 6cm at last ultrasound in 10/2019. She is to have hysterectomy. GYN would like her to be lower than the original 7.5 done three months ago. She also needs to have surgical clearance for hysterectomy. She needs to have ECG. Bmp, and cbc. She has no new concerns or complsints today.       Current Medication: Outpatient Encounter Medications as of 02/19/2020  Medication Sig  . Accu-Chek Softclix Lancets lancets Use as instructed one a day e11.65  . dapagliflozin propanediol (FARXIGA) 5 MG TABS tablet Take 1 tablet (5 mg total) by mouth daily.  Marland Kitchen escitalopram (LEXAPRO) 5 MG tablet Take 2 tablets po qpm (Patient taking differently: Take 10 mg by mouth every evening. Take 2 tablets po qpm)  . glucose blood (ACCU-CHEK GUIDE) test strip Use as instructed Once a day dx e11.65  . losartan (COZAAR) 50 MG tablet Take 1.5 tablets (75 mg total) by mouth daily.  . [DISCONTINUED] norgestimate-ethinyl estradiol (ORTHO-CYCLEN) 0.25-35 MG-MCG tablet Take 0.25-35 tablets by mouth every morning.   No facility-administered encounter medications on file as of 02/19/2020.    Surgical History: Past Surgical History:  Procedure Laterality Date  . BREAST BIOPSY Left 2012   benign  . NO PAST SURGERIES      Medical History: Past Medical History:  Diagnosis Date  . Chest pain   . HTN (hypertension)   . Hypoglycemia   . Hypokalemia      Family History: Family History  Problem Relation Age of Onset  . Heart disease Mother   . Coronary artery disease Mother 34  . Heart failure Mother        chf  . Diabetes Mother   . Heart disease Father        Defibrillator  . Diabetes Father   . Diabetes Sister     Social History   Socioeconomic History  . Marital status: Married    Spouse name: Not on file  . Number of children: 3  . Years of education: Not on file  . Highest education level: Not on file  Occupational History  . 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 Smoking status: Never Smoker  . Smokeless tobacco: Never Used  Substance and Sexual Activity  . Alcohol use: Yes    Alcohol/week: 1.0 standard drink    Types: 1 Glasses of wine per week    Comment: few times a week   . Drug use: No  . Sexual activity: Not on file  Other Topics Concern  . Not on file  Social History Narrative  . Not on file   Social Determinants of Health   Financial Resource Strain: Not on file  Food Insecurity: Not on file  Transportation Needs: Not on file  Physical Activity: Not on file  Stress: Not on file  Social  Connections: Not on file  Intimate Partner Violence: Not on file      Review of Systems  Constitutional: Negative for activity change, chills, fatigue and unexpected weight change.  HENT: Negative for congestion, postnasal drip, rhinorrhea, sneezing and sore throat.   Respiratory: Negative for cough, chest tightness, shortness of breath and wheezing.   Cardiovascular: Negative for chest pain and palpitations.  Gastrointestinal: Negative for abdominal pain, constipation, diarrhea, nausea and vomiting.  Endocrine: Negative for cold intolerance, heat intolerance, polydipsia and polyuria.       Blood sugar log continues to show overall improved blood sugar control   Musculoskeletal: Negative for arthralgias, back pain, joint swelling and neck  pain.  Skin: Negative for rash.  Allergic/Immunologic: Negative for environmental allergies.  Neurological: Negative for dizziness, tremors, numbness and headaches.  Hematological: Negative for adenopathy. Does not bruise/bleed easily.  Psychiatric/Behavioral: Negative for behavioral problems (Depression), sleep disturbance and suicidal ideas. The patient is not nervous/anxious.     Today's Vitals   02/19/20 1335  BP: (!) 142/80  Pulse: 87  Resp: 16  Temp: 98.9 F (37.2 C)  SpO2: 97%  Weight: 214 lb 12.8 oz (97.4 kg)  Height: 5\' 5"  (1.651 m)   Body mass index is 35.74 kg/m.  Physical Exam Vitals and nursing note reviewed.  Constitutional:      General: She is not in acute distress.    Appearance: Normal appearance. She is well-developed and well-nourished. She is obese. She is not diaphoretic.  HENT:     Head: Normocephalic and atraumatic.     Nose: Nose normal.     Mouth/Throat:     Mouth: Oropharynx is clear and moist.     Pharynx: No oropharyngeal exudate.  Eyes:     Extraocular Movements: EOM normal.     Pupils: Pupils are equal, round, and reactive to light.  Neck:     Thyroid: No thyromegaly.     Vascular: No carotid bruit or JVD.     Trachea: No tracheal deviation.  Cardiovascular:     Rate and Rhythm: Normal rate and regular rhythm.     Heart sounds: Normal heart sounds. No murmur heard. No friction rub. No gallop.      Comments: ECG showing low voltage in precordial leads and otherwise normal.  Pulmonary:     Effort: Pulmonary effort is normal. No respiratory distress.     Breath sounds: Normal breath sounds. No wheezing or rales.  Chest:     Chest wall: No tenderness.  Abdominal:     General: Bowel sounds are normal.     Palpations: Abdomen is soft.     Tenderness: There is no abdominal tenderness.  Musculoskeletal:        General: Normal range of motion.     Cervical back: Normal range of motion and neck supple.  Lymphadenopathy:     Cervical: No  cervical adenopathy.  Skin:    General: Skin is warm and dry.     Capillary Refill: Capillary refill takes less than 2 seconds.  Neurological:     General: No focal deficit present.     Mental Status: She is alert and oriented to person, place, and time.     Cranial Nerves: No cranial nerve deficit.  Psychiatric:        Mood and Affect: Mood and affect and mood normal.        Behavior: Behavior normal.        Thought Content: Thought content normal.  Judgment: Judgment normal.    Assessment/Plan: 1. Type 2 diabetes mellitus with hyperglycemia, without long-term current use of insulin (HCC) - POCT HgB A1C 7.1 today. Continue farxiga 5mg  as prescribed. Monitor blood sugars closely   2. Essential hypertension Stable. Continue bp medication as prescribed   3. Uterine leiomyoma, unspecified location Patient planning to have hysterectomy in near future.   4. Preprocedural examination - EKG 12-Lead done in office today showing low voltage in precordial leads but otherwise WNL. Blood sugars better controlled with HgbA1c. Will check CBC and CMP. Patient is clear to have hysterectomy with low risk.   General Counseling: Litisha verbalizes understanding of the findings of todays visit and agrees with plan of treatment. I have discussed any further diagnostic evaluation that may be needed or ordered today. We also reviewed her medications today. she has been encouraged to call the office with any questions or concerns that should arise related to todays visit.  This patient was seen by Leretha Pol FNP Collaboration with Dr Lavera Guise as a part of collaborative care agreement  Orders Placed This Encounter  Procedures  . POCT HgB A1C  . EKG 12-Lead     Total time spent: 45 Minutes   Time spent includes review of chart, medications, test results, and follow up plan with the patient.      Dr Lavera Guise Internal medicine

## 2020-02-20 LAB — BASIC METABOLIC PANEL
BUN/Creatinine Ratio: 14 (ref 9–23)
BUN: 10 mg/dL (ref 6–24)
CO2: 23 mmol/L (ref 20–29)
Calcium: 9.2 mg/dL (ref 8.7–10.2)
Chloride: 103 mmol/L (ref 96–106)
Creatinine, Ser: 0.69 mg/dL (ref 0.57–1.00)
GFR calc Af Amer: 114 mL/min/{1.73_m2} (ref >59)
GFR calc non Af Amer: 99 mL/min/{1.73_m2} (ref >59)
Glucose: 90 mg/dL (ref 65–99)
Potassium: 4.1 mmol/L (ref 3.5–5.2)
Sodium: 140 mmol/L (ref 134–144)

## 2020-02-20 LAB — CBC
Hematocrit: 36.5 % (ref 34.0–46.6)
Hemoglobin: 12 g/dL (ref 11.1–15.9)
MCH: 29.3 pg (ref 26.6–33.0)
MCHC: 32.9 g/dL (ref 31.5–35.7)
MCV: 89 fL (ref 79–97)
Platelets: 149 10*3/uL — ABNORMAL LOW (ref 150–450)
RBC: 4.1 x10E6/uL (ref 3.77–5.28)
RDW: 11.5 % — ABNORMAL LOW (ref 11.7–15.4)
WBC: 8.4 10*3/uL (ref 3.4–10.8)

## 2020-02-22 ENCOUNTER — Other Ambulatory Visit: Payer: Self-pay | Admitting: Obstetrics & Gynecology

## 2020-03-03 ENCOUNTER — Ambulatory Visit: Payer: No Typology Code available for payment source | Admitting: Nurse Practitioner

## 2020-03-16 DIAGNOSIS — Z01818 Encounter for other preprocedural examination: Secondary | ICD-10-CM | POA: Insufficient documentation

## 2020-03-20 LAB — COLOGUARD

## 2020-03-24 ENCOUNTER — Encounter
Admission: RE | Admit: 2020-03-24 | Discharge: 2020-03-24 | Disposition: A | Discharge status: Home / Self Care | Payer: No Typology Code available for payment source | Source: Ambulatory Visit | Attending: Obstetrics & Gynecology | Admitting: Obstetrics & Gynecology

## 2020-03-24 ENCOUNTER — Other Ambulatory Visit: Payer: Self-pay

## 2020-03-24 HISTORY — DX: Plantar fascial fibromatosis: M72.2

## 2020-03-24 HISTORY — DX: Fatty (change of) liver, not elsewhere classified: K76.0

## 2020-03-24 HISTORY — DX: Nausea with vomiting, unspecified: R11.2

## 2020-03-24 HISTORY — DX: Type 2 diabetes mellitus without complications: E11.9

## 2020-03-24 HISTORY — DX: Other specified postprocedural states: Z98.890

## 2020-03-24 NOTE — Patient Instructions (Addendum)
Your procedure is scheduled on:  Monday, December 27 Report to the Registration Desk on the 1st floor of the [facility]. To find out your arrival time, please call [phone] between 1PM - 3PM on: Thursday, December 23  REMEMBER: Instructions that are not followed completely may result in serious medical risk, up to and including death; or upon the discretion of your surgeon and anesthesiologist your surgery may need to be rescheduled.  Do not eat food after midnight the night before surgery.  No gum chewing, lozengers or hard candies.  You may however, drink water up to 2 hours before you are scheduled to arrive for your surgery. Do not drink anything within 2 hours of your scheduled arrival time.  DO NOT TAKE ANY MEDICATIONS THE MORNING OF SURGERY   One week prior to surgery: Stop Anti-inflammatories (NSAIDS) such as Advil, Aleve, Ibuprofen, Motrin, Naproxen, Naprosyn and Aspirin based products such as Excedrin, Goodys Powder, BC Powder. Stop ANY OVER THE COUNTER supplements until after surgery.  No Alcohol for 24 hours before or after surgery.  No Smoking including e-cigarettes for 24 hours prior to surgery.  No chewable tobacco products for at least 6 hours prior to surgery.  No nicotine patches on the day of surgery.  Do not use any "recreational" drugs for at least a week prior to your surgery.  Please be advised that the combination of cocaine and anesthesia may have negative outcomes, up to and including death. If you test positive for cocaine, your surgery will be cancelled.  On the morning of surgery brush your teeth with toothpaste and water, you may rinse your mouth with mouthwash if you wish. Do not swallow any toothpaste or mouthwash.  Do not wear jewelry, make-up, hairpins, clips or nail polish.  Do not wear lotions, powders, or perfumes.   Do not shave body from the neck down 48 hours prior to surgery just in case you cut yourself which could leave a site for  infection.  Also, freshly shaved skin may become irritated if using the CHG soap.  Do not bring valuables to the hospital. [facility] is not responsible for any missing/lost belongings or valuables.   Use CHG Soap as directed on instruction sheet.  Notify your doctor if there is any change in your medical condition (cold, fever, infection).  Wear comfortable clothing (specific to your surgery type) to the hospital.  Plan for stool softeners for home use; pain medications have a tendency to cause constipation. You can also help prevent constipation by eating foods high in fiber such as fruits and vegetables and drinking plenty of fluids as your diet allows.  After surgery, you can help prevent lung complications by doing breathing exercises.  Take deep breaths and cough every 1-2 hours. Your doctor may order a device called an Incentive Spirometer to help you take deep breaths. When coughing or sneezing, hold a pillow firmly against your incision with both hands. This is called "splinting." Doing this helps protect your incision. It also decreases belly discomfort.  If you are being discharged the day of surgery, you will not be allowed to drive home. You will need a responsible adult (18 years or older) to drive you home and stay with you that night.   If you are taking public transportation, you will need to have a responsible adult (18 years or older) with you. Please confirm with your physician that it is acceptable to use public transportation.   Please call the Pre-admissions Testing  Dept. at [phone] if you have any questions about these instructions.  Visitation Policy:  Patients undergoing a surgery or procedure may have one family member or support person with them as long as that person is not COVID-19 positive or experiencing its symptoms.  That person may remain in the waiting area during the procedure.

## 2020-03-27 ENCOUNTER — Other Ambulatory Visit
Admission: RE | Admit: 2020-03-27 | Discharge: 2020-03-27 | Disposition: A | Discharge status: Home / Self Care | Payer: No Typology Code available for payment source | Source: Ambulatory Visit | Attending: Obstetrics & Gynecology | Admitting: Obstetrics & Gynecology

## 2020-03-27 ENCOUNTER — Other Ambulatory Visit: Payer: Self-pay

## 2020-03-27 DIAGNOSIS — Z20822 Contact with and (suspected) exposure to covid-19: Secondary | ICD-10-CM | POA: Insufficient documentation

## 2020-03-27 DIAGNOSIS — Z01812 Encounter for preprocedural laboratory examination: Secondary | ICD-10-CM | POA: Diagnosis not present

## 2020-03-27 LAB — SARS CORONAVIRUS 2 (TAT 6-24 HRS): SARS Coronavirus 2: NEGATIVE

## 2020-03-31 ENCOUNTER — Encounter: Payer: Self-pay | Admitting: Obstetrics & Gynecology

## 2020-03-31 ENCOUNTER — Encounter
Admission: RE | Disposition: A | Discharge status: Home / Self Care | Payer: Self-pay | Source: Home / Self Care | Attending: Obstetrics & Gynecology

## 2020-03-31 ENCOUNTER — Other Ambulatory Visit: Payer: Self-pay

## 2020-03-31 ENCOUNTER — Ambulatory Visit: Payer: No Typology Code available for payment source | Admitting: Certified Registered Nurse Anesthetist

## 2020-03-31 ENCOUNTER — Observation Stay
Admission: RE | Admit: 2020-03-31 | Discharge: 2020-04-01 | Disposition: A | Discharge status: Home / Self Care | Payer: No Typology Code available for payment source | Attending: Obstetrics & Gynecology | Admitting: Obstetrics & Gynecology

## 2020-03-31 DIAGNOSIS — E119 Type 2 diabetes mellitus without complications: Secondary | ICD-10-CM | POA: Diagnosis not present

## 2020-03-31 DIAGNOSIS — N838 Other noninflammatory disorders of ovary, fallopian tube and broad ligament: Secondary | ICD-10-CM | POA: Insufficient documentation

## 2020-03-31 DIAGNOSIS — N9971 Accidental puncture and laceration of a genitourinary system organ or structure during a genitourinary system procedure: Secondary | ICD-10-CM | POA: Insufficient documentation

## 2020-03-31 DIAGNOSIS — Z90710 Acquired absence of both cervix and uterus: Secondary | ICD-10-CM | POA: Diagnosis present

## 2020-03-31 DIAGNOSIS — I10 Essential (primary) hypertension: Secondary | ICD-10-CM | POA: Diagnosis not present

## 2020-03-31 DIAGNOSIS — D259 Leiomyoma of uterus, unspecified: Principal | ICD-10-CM | POA: Insufficient documentation

## 2020-03-31 DIAGNOSIS — N938 Other specified abnormal uterine and vaginal bleeding: Secondary | ICD-10-CM | POA: Diagnosis present

## 2020-03-31 DIAGNOSIS — Z79899 Other long term (current) drug therapy: Secondary | ICD-10-CM | POA: Diagnosis not present

## 2020-03-31 HISTORY — PX: CYSTOSCOPY W/ URETERAL STENT PLACEMENT: SHX1429

## 2020-03-31 HISTORY — PX: TOTAL LAPAROSCOPIC HYSTERECTOMY WITH SALPINGECTOMY: SHX6742

## 2020-03-31 LAB — TYPE AND SCREEN
ABO/RH(D): AB POS
Antibody Screen: NEGATIVE

## 2020-03-31 LAB — BASIC METABOLIC PANEL
Anion gap: 11 (ref 5–15)
BUN: 10 mg/dL (ref 6–20)
CO2: 23 mmol/L (ref 22–32)
Calcium: 8.7 mg/dL — ABNORMAL LOW (ref 8.9–10.3)
Chloride: 104 mmol/L (ref 98–111)
Creatinine, Ser: 0.66 mg/dL (ref 0.44–1.00)
GFR, Estimated: >60 mL/min (ref >60)
Glucose, Bld: 151 mg/dL — ABNORMAL HIGH (ref 70–99)
Potassium: 3.5 mmol/L (ref 3.5–5.1)
Sodium: 138 mmol/L (ref 135–145)

## 2020-03-31 LAB — CBC
HCT: 39.8 % (ref 36.0–46.0)
Hemoglobin: 13 g/dL (ref 12.0–15.0)
MCH: 28.7 pg (ref 26.0–34.0)
MCHC: 32.7 g/dL (ref 30.0–36.0)
MCV: 87.9 fL (ref 80.0–100.0)
Platelets: 247 10*3/uL (ref 150–400)
RBC: 4.53 MIL/uL (ref 3.87–5.11)
RDW: 13.3 % (ref 11.5–15.5)
WBC: 8.5 10*3/uL (ref 4.0–10.5)
nRBC: 0 % (ref 0.0–0.2)

## 2020-03-31 LAB — POCT PREGNANCY, URINE: Preg Test, Ur: NEGATIVE

## 2020-03-31 LAB — ABO/RH: ABO/RH(D): AB POS

## 2020-03-31 LAB — GLUCOSE, CAPILLARY
Glucose-Capillary: 138 mg/dL — ABNORMAL HIGH (ref 70–99)
Glucose-Capillary: 169 mg/dL — ABNORMAL HIGH (ref 70–99)

## 2020-03-31 SURGERY — HYSTERECTOMY, TOTAL, LAPAROSCOPIC, WITH SALPINGECTOMY
Anesthesia: General | Laterality: Bilateral

## 2020-03-31 MED ORDER — ENSURE PRE-SURGERY PO LIQD
296.0000 mL | Freq: Once | ORAL | Status: DC
  Filled 2020-03-31: qty 296

## 2020-03-31 MED ORDER — ONDANSETRON HCL 4 MG/2ML IJ SOLN: 4.0000 mg | Freq: Once | INTRAMUSCULAR | Status: DC | PRN

## 2020-03-31 MED ORDER — ESMOLOL HCL 100 MG/10ML IV SOLN
INTRAVENOUS | Status: DC | PRN
  Administered 2020-03-31: 20 mg via INTRAVENOUS

## 2020-03-31 MED ORDER — ACETAMINOPHEN 500 MG PO TABS
ORAL_TABLET | ORAL | Status: AC
  Administered 2020-03-31: 1000 mg via ORAL
  Filled 2020-03-31: qty 2

## 2020-03-31 MED ORDER — FAMOTIDINE 20 MG PO TABS: 20.0000 mg | ORAL_TABLET | Freq: Once | ORAL | Status: AC

## 2020-03-31 MED ORDER — CHLORHEXIDINE GLUCONATE 0.12 % MT SOLN: 15.0000 mL | Freq: Once | OROMUCOSAL | Status: AC

## 2020-03-31 MED ORDER — MIDAZOLAM HCL 2 MG/2ML IJ SOLN
INTRAMUSCULAR | Status: AC
  Filled 2020-03-31: qty 2

## 2020-03-31 MED ORDER — ACETAMINOPHEN 500 MG PO TABS: 1000.0000 mg | ORAL_TABLET | ORAL | Status: AC

## 2020-03-31 MED ORDER — ONDANSETRON HCL 4 MG/2ML IJ SOLN
INTRAMUSCULAR | Status: DC | PRN
  Administered 2020-03-31: 4 mg via INTRAVENOUS

## 2020-03-31 MED ORDER — ACETAMINOPHEN 500 MG PO TABS: 1000.0000 mg | ORAL_TABLET | Freq: Four times a day (QID) | ORAL | Status: DC

## 2020-03-31 MED ORDER — HEPARIN SODIUM (PORCINE) 5000 UNIT/ML IJ SOLN
INTRAMUSCULAR | Status: AC
  Administered 2020-03-31: 5000 [IU] via SUBCUTANEOUS
  Filled 2020-03-31: qty 1

## 2020-03-31 MED ORDER — DEXAMETHASONE SODIUM PHOSPHATE 10 MG/ML IJ SOLN
INTRAMUSCULAR | Status: DC | PRN
  Administered 2020-03-31: 10 mg via INTRAVENOUS

## 2020-03-31 MED ORDER — KETOROLAC TROMETHAMINE 30 MG/ML IJ SOLN
INTRAMUSCULAR | Status: AC
  Filled 2020-03-31: qty 1

## 2020-03-31 MED ORDER — SODIUM CHLORIDE 0.9 % IV SOLN: INTRAVENOUS | Status: DC

## 2020-03-31 MED ORDER — LACTATED RINGERS IV SOLN: INTRAVENOUS | Status: DC | PRN

## 2020-03-31 MED ORDER — ONDANSETRON HCL 4 MG/2ML IJ SOLN: 4.0000 mg | Freq: Four times a day (QID) | INTRAMUSCULAR | Status: DC | PRN

## 2020-03-31 MED ORDER — GABAPENTIN 300 MG PO CAPS
ORAL_CAPSULE | ORAL | Status: AC
  Administered 2020-03-31: 600 mg via ORAL
  Filled 2020-03-31: qty 2

## 2020-03-31 MED ORDER — FENTANYL CITRATE (PF) 100 MCG/2ML IJ SOLN
INTRAMUSCULAR | Status: AC
  Filled 2020-03-31: qty 2

## 2020-03-31 MED ORDER — IPRATROPIUM-ALBUTEROL 0.5-2.5 (3) MG/3ML IN SOLN
3.0000 mL | Freq: Once | RESPIRATORY_TRACT | Status: AC
  Administered 2020-03-31: 3 mL via RESPIRATORY_TRACT

## 2020-03-31 MED ORDER — SIMETHICONE 80 MG PO CHEW
160.0000 mg | CHEWABLE_TABLET | Freq: Four times a day (QID) | ORAL | Status: DC | PRN
  Filled 2020-03-31: qty 2

## 2020-03-31 MED ORDER — NEOSTIGMINE METHYLSULFATE 10 MG/10ML IV SOLN
INTRAVENOUS | Status: DC | PRN
  Administered 2020-03-31: 5 mg via INTRAVENOUS

## 2020-03-31 MED ORDER — SODIUM CHLORIDE 0.9 % IV SOLN: INTRAVENOUS | Status: DC | PRN

## 2020-03-31 MED ORDER — DOCUSATE SODIUM 100 MG PO CAPS
100.0000 mg | ORAL_CAPSULE | Freq: Two times a day (BID) | ORAL | Status: DC
  Administered 2020-03-31 – 2020-04-01 (×2): 100 mg via ORAL
  Filled 2020-03-31 (×2): qty 1

## 2020-03-31 MED ORDER — GLYCOPYRROLATE 0.2 MG/ML IJ SOLN
INTRAMUSCULAR | Status: DC | PRN
  Administered 2020-03-31: .6 mg via INTRAVENOUS
  Administered 2020-03-31: .2 mg via INTRAVENOUS

## 2020-03-31 MED ORDER — SUGAMMADEX SODIUM 200 MG/2ML IV SOLN
INTRAVENOUS | Status: DC | PRN
  Administered 2020-03-31: 80 mg via INTRAVENOUS
  Administered 2020-03-31: 100 mg via INTRAVENOUS
  Administered 2020-03-31: 20 mg via INTRAVENOUS
  Administered 2020-03-31 (×2): 50 mg via INTRAVENOUS

## 2020-03-31 MED ORDER — KETOROLAC TROMETHAMINE 15 MG/ML IJ SOLN
INTRAMUSCULAR | Status: AC
  Administered 2020-03-31: 15 mg via INTRAVENOUS
  Filled 2020-03-31: qty 1

## 2020-03-31 MED ORDER — PROPOFOL 10 MG/ML IV BOLUS
INTRAVENOUS | Status: AC
  Filled 2020-03-31: qty 20

## 2020-03-31 MED ORDER — CEFAZOLIN SODIUM-DEXTROSE 2-4 GM/100ML-% IV SOLN
INTRAVENOUS | Status: AC
  Filled 2020-03-31: qty 100

## 2020-03-31 MED ORDER — ORAL CARE MOUTH RINSE: 15.0000 mL | Freq: Once | OROMUCOSAL | Status: AC

## 2020-03-31 MED ORDER — PHENYLEPHRINE HCL (PRESSORS) 10 MG/ML IV SOLN
INTRAVENOUS | Status: DC | PRN
  Administered 2020-03-31 (×3): 100 ug via INTRAVENOUS

## 2020-03-31 MED ORDER — PROPOFOL 500 MG/50ML IV EMUL
INTRAVENOUS | Status: AC
  Filled 2020-03-31: qty 50

## 2020-03-31 MED ORDER — KETOROLAC TROMETHAMINE 15 MG/ML IJ SOLN: 15.0000 mg | INTRAMUSCULAR | Status: AC

## 2020-03-31 MED ORDER — IPRATROPIUM-ALBUTEROL 0.5-2.5 (3) MG/3ML IN SOLN
RESPIRATORY_TRACT | Status: AC
  Filled 2020-03-31: qty 3

## 2020-03-31 MED ORDER — HEMOSTATIC AGENTS (NO CHARGE) OPTIME
TOPICAL | Status: DC | PRN
  Administered 2020-03-31: 1 via TOPICAL

## 2020-03-31 MED ORDER — SCOPOLAMINE 1 MG/3DAYS TD PT72: 1.0000 | MEDICATED_PATCH | Freq: Once | TRANSDERMAL | Status: DC

## 2020-03-31 MED ORDER — PROPOFOL 500 MG/50ML IV EMUL
INTRAVENOUS | Status: DC | PRN
  Administered 2020-03-31: 20 ug/kg/min via INTRAVENOUS

## 2020-03-31 MED ORDER — FENTANYL CITRATE (PF) 100 MCG/2ML IJ SOLN
INTRAMUSCULAR | Status: DC | PRN
  Administered 2020-03-31: 50 ug via INTRAVENOUS
  Administered 2020-03-31: 25 ug via INTRAVENOUS
  Administered 2020-03-31: 50 ug via INTRAVENOUS
  Administered 2020-03-31: 25 ug via INTRAVENOUS
  Administered 2020-03-31: 50 ug via INTRAVENOUS

## 2020-03-31 MED ORDER — HEPARIN SODIUM (PORCINE) 5000 UNIT/ML IJ SOLN: 5000.0000 [IU] | INTRAMUSCULAR | Status: AC

## 2020-03-31 MED ORDER — FLUORESCEIN SODIUM 10 % IV SOLN
INTRAVENOUS | Status: DC | PRN
  Administered 2020-03-31: 50 mg via INTRAVENOUS

## 2020-03-31 MED ORDER — FAMOTIDINE 20 MG PO TABS
ORAL_TABLET | ORAL | Status: AC
  Administered 2020-03-31: 20 mg via ORAL
  Filled 2020-03-31: qty 1

## 2020-03-31 MED ORDER — SCOPOLAMINE 1 MG/3DAYS TD PT72
MEDICATED_PATCH | TRANSDERMAL | Status: AC
  Administered 2020-03-31: 1.5 mg via TRANSDERMAL
  Filled 2020-03-31: qty 1

## 2020-03-31 MED ORDER — IBUPROFEN 600 MG PO TABS
600.0000 mg | ORAL_TABLET | Freq: Four times a day (QID) | ORAL | Status: DC
  Administered 2020-03-31: 600 mg via ORAL
  Filled 2020-03-31: qty 1

## 2020-03-31 MED ORDER — PHENAZOPYRIDINE HCL 200 MG PO TABS
200.0000 mg | ORAL_TABLET | Freq: Three times a day (TID) | ORAL | Status: DC
  Administered 2020-03-31 – 2020-04-01 (×3): 200 mg via ORAL
  Filled 2020-03-31 (×5): qty 1

## 2020-03-31 MED ORDER — FENTANYL CITRATE (PF) 100 MCG/2ML IJ SOLN: 25.0000 ug | INTRAMUSCULAR | Status: DC | PRN

## 2020-03-31 MED ORDER — MIDAZOLAM HCL 2 MG/2ML IJ SOLN
INTRAMUSCULAR | Status: DC | PRN
  Administered 2020-03-31: 2 mg via INTRAVENOUS

## 2020-03-31 MED ORDER — LIDOCAINE HCL (CARDIAC) PF 100 MG/5ML IV SOSY
PREFILLED_SYRINGE | INTRAVENOUS | Status: DC | PRN
  Administered 2020-03-31: 100 mg via INTRAVENOUS

## 2020-03-31 MED ORDER — PROPOFOL 10 MG/ML IV BOLUS
INTRAVENOUS | Status: DC | PRN
  Administered 2020-03-31: 160 mg via INTRAVENOUS
  Administered 2020-03-31: 40 mg via INTRAVENOUS

## 2020-03-31 MED ORDER — FLUORESCEIN SODIUM 10 % IV SOLN
INTRAVENOUS | Status: AC
  Filled 2020-03-31: qty 5

## 2020-03-31 MED ORDER — KETOROLAC TROMETHAMINE 30 MG/ML IJ SOLN
30.0000 mg | Freq: Once | INTRAMUSCULAR | Status: AC
  Administered 2020-03-31: 30 mg via INTRAVENOUS

## 2020-03-31 MED ORDER — CEFAZOLIN SODIUM 1 G IJ SOLR
INTRAMUSCULAR | Status: AC
  Filled 2020-03-31: qty 20

## 2020-03-31 MED ORDER — MENTHOL 3 MG MT LOZG
1.0000 | LOZENGE | OROMUCOSAL | Status: DC | PRN
  Filled 2020-03-31: qty 9

## 2020-03-31 MED ORDER — GABAPENTIN 300 MG PO CAPS: 600.0000 mg | ORAL_CAPSULE | ORAL | Status: AC

## 2020-03-31 MED ORDER — OXYBUTYNIN CHLORIDE ER 10 MG PO TB24
10.0000 mg | ORAL_TABLET | Freq: Every day | ORAL | Status: DC
  Administered 2020-03-31: 10 mg via ORAL
  Filled 2020-03-31 (×2): qty 1

## 2020-03-31 MED ORDER — ROCURONIUM BROMIDE 100 MG/10ML IV SOLN
INTRAVENOUS | Status: DC | PRN
  Administered 2020-03-31: 10 mg via INTRAVENOUS
  Administered 2020-03-31: 20 mg via INTRAVENOUS
  Administered 2020-03-31: 10 mg via INTRAVENOUS
  Administered 2020-03-31: 30 mg via INTRAVENOUS
  Administered 2020-03-31: 20 mg via INTRAVENOUS
  Administered 2020-03-31: 50 mg via INTRAVENOUS
  Administered 2020-03-31: 10 mg via INTRAVENOUS
  Administered 2020-03-31: 20 mg via INTRAVENOUS

## 2020-03-31 MED ORDER — OXYCODONE HCL 5 MG PO TABS
5.0000 mg | ORAL_TABLET | ORAL | Status: DC | PRN
  Administered 2020-03-31 – 2020-04-01 (×3): 10 mg via ORAL
  Filled 2020-03-31 (×3): qty 2

## 2020-03-31 MED ORDER — SENNOSIDES-DOCUSATE SODIUM 8.6-50 MG PO TABS: 1.0000 | ORAL_TABLET | Freq: Every evening | ORAL | Status: DC | PRN

## 2020-03-31 MED ORDER — ONDANSETRON HCL 4 MG PO TABS: 4.0000 mg | ORAL_TABLET | Freq: Four times a day (QID) | ORAL | Status: DC | PRN

## 2020-03-31 MED ORDER — IBUPROFEN 600 MG PO TABS
600.0000 mg | ORAL_TABLET | Freq: Four times a day (QID) | ORAL | Status: DC | PRN
  Administered 2020-04-01: 600 mg via ORAL
  Filled 2020-03-31: qty 1

## 2020-03-31 MED ORDER — SODIUM CHLORIDE 0.9 % IV SOLN
INTRAVENOUS | Status: DC | PRN
  Administered 2020-03-31: 30 ug/min via INTRAVENOUS

## 2020-03-31 MED ORDER — CEFAZOLIN SODIUM-DEXTROSE 2-4 GM/100ML-% IV SOLN
2.0000 g | INTRAVENOUS | Status: AC
  Administered 2020-03-31 (×2): 2 g via INTRAVENOUS

## 2020-03-31 MED ORDER — CHLORHEXIDINE GLUCONATE 0.12 % MT SOLN
OROMUCOSAL | Status: AC
  Administered 2020-03-31: 15 mL via OROMUCOSAL
  Filled 2020-03-31: qty 15

## 2020-03-31 MED ORDER — SCOPOLAMINE 1 MG/3DAYS TD PT72: 1.0000 | MEDICATED_PATCH | TRANSDERMAL | Status: DC

## 2020-03-31 SURGICAL SUPPLY — 66 items
APPLICATOR ARISTA FLEXITIP XL (MISCELLANEOUS) ×2 IMPLANT
BACTOSHIELD CHG 4% 4OZ (MISCELLANEOUS) ×1
BAG URINE DRAIN 2000ML AR STRL (UROLOGICAL SUPPLIES) ×4 IMPLANT
BASIN GRAD PLASTIC 32OZ STRL (MISCELLANEOUS) ×2 IMPLANT
BINDER ABDOMINAL  9 SM 30-45 (SOFTGOODS)
BINDER ABDOMINAL 12 ML 46-62 (SOFTGOODS) ×2 IMPLANT
BINDER ABDOMINAL 9 SM 30-45 (SOFTGOODS) IMPLANT
BLADE SURG SZ11 CARB STEEL (BLADE) ×4 IMPLANT
CATH FOL 2WAY LX 18X5 (CATHETERS) ×2 IMPLANT
CATH FOLEY 2WAY  5CC 16FR (CATHETERS) ×1
CATH URETL 5X70 OPEN END (CATHETERS) ×4 IMPLANT
CATH URTH 16FR FL 2W BLN LF (CATHETERS) ×1 IMPLANT
CHLORAPREP W/TINT 26 (MISCELLANEOUS) ×2 IMPLANT
COUNTER NEEDLE 20/40 LG (NEEDLE) ×4 IMPLANT
COVER WAND RF STERILE (DRAPES) ×2 IMPLANT
DEFOGGER SCOPE WARMER CLEARIFY (MISCELLANEOUS) ×2 IMPLANT
DERMABOND ADVANCED (GAUZE/BANDAGES/DRESSINGS) ×2
DERMABOND ADVANCED .7 DNX12 (GAUZE/BANDAGES/DRESSINGS) ×2 IMPLANT
DEVICE SUTURE ENDOST 10MM (ENDOMECHANICALS) ×2 IMPLANT
DRAPE 3/4 80X56 (DRAPES) ×4 IMPLANT
DRAPE LEGGINS SURG 28X43 STRL (DRAPES) ×2 IMPLANT
DRAPE UNDER BUTTOCK W/FLU (DRAPES) ×4 IMPLANT
ELECT REM PT RETURN 9FT ADLT (ELECTROSURGICAL) ×2
ELECTRODE REM PT RTRN 9FT ADLT (ELECTROSURGICAL) ×1 IMPLANT
GAUZE 4X4 16PLY RFD (DISPOSABLE) ×2 IMPLANT
GAUZE SPONGE 4X4 16PLY XRAY LF (GAUZE/BANDAGES/DRESSINGS) ×2 IMPLANT
GLOVE PI ORTHOPRO 6.5 (GLOVE) ×8
GLOVE PI ORTHOPRO STRL 6.5 (GLOVE) ×8 IMPLANT
GLOVE SURG SYN 6.5 ES PF (GLOVE) ×16 IMPLANT
GOWN STRL REUS W/ TWL LRG LVL3 (GOWN DISPOSABLE) ×8 IMPLANT
GOWN STRL REUS W/ TWL XL LVL3 (GOWN DISPOSABLE) ×1 IMPLANT
GOWN STRL REUS W/TWL LRG LVL3 (GOWN DISPOSABLE) ×8
GOWN STRL REUS W/TWL XL LVL3 (GOWN DISPOSABLE) ×1
GRASPER SUT TROCAR 14GX15 (MISCELLANEOUS) ×2 IMPLANT
GUIDEWIRE STR DUAL SENSOR (WIRE) ×2 IMPLANT
HANDLE YANKAUER SUCT BULB TIP (MISCELLANEOUS) ×2 IMPLANT
HEMOSTAT ARISTA ABSORB 3G PWDR (HEMOSTASIS) ×2 IMPLANT
IRRIGATION STRYKERFLOW (MISCELLANEOUS) ×2 IMPLANT
IRRIGATOR STRYKERFLOW (MISCELLANEOUS) ×4
IV LACTATED RINGERS 1000ML (IV SOLUTION) ×8 IMPLANT
KIT PINK PAD W/HEAD ARE REST (MISCELLANEOUS) ×2
KIT PINK PAD W/HEAD ARM REST (MISCELLANEOUS) ×1 IMPLANT
KIT TURNOVER CYSTO (KITS) ×2 IMPLANT
L-HOOK LAP DISP 36CM (ELECTROSURGICAL) ×2
LHOOK LAP DISP 36CM (ELECTROSURGICAL) ×1 IMPLANT
MANIFOLD NEPTUNE II (INSTRUMENTS) ×2 IMPLANT
MANIPULATOR VCARE STD CRV RETR (MISCELLANEOUS) ×2 IMPLANT
NS IRRIG 500ML POUR BTL (IV SOLUTION) ×2 IMPLANT
PACK LAP CHOLECYSTECTOMY (MISCELLANEOUS) ×4 IMPLANT
PAD OB MATERNITY 4.3X12.25 (PERSONAL CARE ITEMS) ×2 IMPLANT
PAD PREP 24X41 OB/GYN DISP (PERSONAL CARE ITEMS) ×2 IMPLANT
PENCIL ELECTRO HAND CTR (MISCELLANEOUS) ×2 IMPLANT
SCISSORS METZENBAUM CVD 33 (INSTRUMENTS) ×2 IMPLANT
SCRUB CHG 4% DYNA-HEX 4OZ (MISCELLANEOUS) ×1 IMPLANT
SET CYSTO W/LG BORE CLAMP LF (SET/KITS/TRAYS/PACK) ×4 IMPLANT
SHEARS HARMONIC ACE PLUS 36CM (ENDOMECHANICALS) ×2 IMPLANT
SLEEVE ENDOPATH XCEL 5M (ENDOMECHANICALS) ×4 IMPLANT
SURGILUBE 2OZ TUBE FLIPTOP (MISCELLANEOUS) ×2 IMPLANT
SUT ENDO VLOC 180-0-8IN (SUTURE) ×6 IMPLANT
SUT MNCRL 4-0 (SUTURE) ×2
SUT MNCRL 4-0 27XMFL (SUTURE) ×2
SUT VIC AB 0 CT1 36 (SUTURE) ×4 IMPLANT
SUT VICRYL 0 AB UR-6 (SUTURE) ×2 IMPLANT
SUTURE MNCRL 4-0 27XMF (SUTURE) ×2 IMPLANT
TROCAR XCEL NON-BLD 5MMX100MML (ENDOMECHANICALS) ×4 IMPLANT
TUBING EVAC SMOKE HEATED PNEUM (TUBING) ×4 IMPLANT

## 2020-03-31 NOTE — Op Note (Signed)
Total Laparoscopic Hysterectomy Operative Note Procedure Date: 03/31/2020  Patient:  HAZELINE BACHRACH  54 y.o. female  PRE-OPERATIVE DIAGNOSIS:  dysfunctional uterine bleeding, fibroid uterus  POST-OPERATIVE DIAGNOSIS:  dysfunctional uterine bleeding, fibroid uterus  PROCEDURE:  Procedure(s): TOTAL LAPAROSCOPIC HYSTERECTOMY WITH BILATERAL SALPINGECTOMY (Bilateral) CYSTOSCOPY WITH STENT REPLACEMENT (Bilateral)  Laparoscopic cystotomy repair  SURGEON:  Surgeon(s) and Role: Panel 1:    * Tezra Mahr, Honor Loh, MD - Primary    * Benjaman Kindler, MD - Assisting Panel 2:    * Bernardo Heater, Ronda Fairly, MD - Primary       McDermitt - Assisting  ANESTHESIA:  General via ET  I/O  See flowsheets  FINDINGS:   1. Large, mobile fibroid uterus  2. normal ovaries and fallopian tubes bilaterally   3. Normal upper abdomen. 4. No obvious evidence of endometriosis, but left sided scarring could be secondary to this 5. Initial Cystoscopy: 11mm full-thickness defect posteriorly, near the right ureteral orifice.  Bilateral ureteral jets.  6. Subsequent cystoscopy (urology): cystotomy visualized with a <31mm satellite cystotomy along side.  Ureteral Stents placed without difficulty.  7. Laparoscopy: defect visualized abdominally, with spillage. and able to be repaired and imbricated, with omentum overlap  8. Cystoscopy: bladder filled with saline, no further defect visualized, and after stents removed, the bilateral ureteral jets were visualized.  9. Laparoscopy: defect was intact and no spillage with bladder full.    SPECIMEN: Morcellated fibroid uterus, Cervix, and bilateral fallopian tubes  COMPLICATIONS: 67mm cystotomy  DISPOSITION: vital signs stable to PACU  Indication for Surgery: 54 y.o. with menorrhagia, pelvic pressure, and fibroid uterus requested definitive treatment.   Risks of surgery were discussed with the patient including but not limited to: bleeding which may require transfusion or reoperation;  infection which may require antibiotics; injury to bowel, bladder, ureters or other surrounding organs; need for additional procedures including laparotomy, blood clot, incisional problems and other postoperative/anesthesia complications. Written informed consent was obtained.      PROCEDURE IN DETAIL:  The patient had 5000u Heparin Sub-q and sequential compression devices applied to her lower extremities while in the preoperative area.  She was then taken to the operating room. IV antibiotics were given. General anesthesia was administered via endotracheal route.  She was placed in the dorsal lithotomy position, and was prepped and draped in a sterile manner. A surgical time-out was performed.  A Foley catheter was inserted into her bladder and attached to constant drainage and a V-Care uterine manipulator was then advanced into the uterus and a good fit around the cervix was noted. Attention was turned to the abdomen where an umbilical incision was made with the scalpel.  A 78mm trochar was inserted in the umbilical incision using a visiport method.Opening pressure was 54mmHg, and the abdomen was insufflated to 69mmHg carbon dioxide gas and adequate pneumoperitoneum was obtained. A survey of the patient's pelvis and abdomen revealed the findings as mentioned above. Two 26mm ports were inserted in the lower left and right quadrants under visualization.    The uterus was deviated leftward within the pelvis. The left fallopian tube was divided along the mesosalpinx and the bilateral fallopian tubes were separated from the mesosalpinx. The left uteroovarian ligament and vessels were cauterized and transected. The round ligament was divided, and the anterior and posterior ligaments were divided and this offered more mobility.  The left ureter was then identified easily, and away from the uterine vessels. The remainder of the anterior broad ligament was divided and brought across  the uterus to separate the  vesicouterine peritoneum and create a bladder flap. The bladder was pushed away from the uterus. The left uterine vessels were skeletonized and ligated. The attention was then turned to the right, and the sequence of steps were repeated to dissect the fallopian tube, cornua, and uterine arteries. There were anterior and posterior aberrant vessels along the cervix.  The bilateral uterosacral and cardinal ligaments were ligated and transected. A colpotomy was made around the V-Care cervical cup and the uterus, cervix, and bilateral tubes were removed through the vagina by morcellation. The vaginal cuff was closed vaginally using 0-Vicryl in a running locking stitch. This was tested for integrity using the surgeon's finger. The pneumoperitoneum was recreated and surgical site inspected, and found to be hemostatic.  The pneumoperitoneum was then decreased to opening pressure and there was a small area of adipose tissue bleeding within the bladder flap.  This was lightly cauterized with the bovie hook, and was then hemostatic. Bilateral ureters were visualized vermiuclating. No intraoperative injury to surrounding organs was noted. The abdomen was desufflated and all instruments were then removed.   All skin incisions were closed with 4-0 monocryl and covered with surgical glue.  As we were concluding the case, the foley catheter was removed as the instruments were being removed from the surgical field.  The collection bag was observed to be inflated like a balloon. This was unusual and raised concern there was a leak between the abdomen and the bladder.  We then performed a cystotomy which showed bilateral ureteral jets, and a small round full thickness hole near the trigone on the right.  Urology was called, and after a short delay Dr. Lonna Cobb arrived and suggested we also involve Dr. McDermitt.  They evaluated the defect from the bladder, noted a second burn that was adjacent to the initial defect, and  suggested the defect be repaired abdominally.  The choice was an open incision or laparoscopically.  We decided to collaborate and gynecology would complete the laparoscopic portion with an endostitch.   The trochars were reinserted, pneumoperitoneum recreated, and an 11-mm trochar placed in the LLQ instead of 74mm.  The defects were easily identified in the bladder flap.  Urology placed bilateral stents to protect the ureters during the repair.  The endostitch using V-lock was used to reapproximate the edges of each defect, and then to imbricate the seromuscular layer over both.  The bladder was then inflated and there was no spilling into the abdominal cavity. Once this was determined, the omentum was then grasped and reached into the pelvis without tension. A second v-lock stitch was then triangulated between the uterosacral ligaments, the anterior bladder, and the inferior edge of the omentum.  This was secured across the bladder repair to protect from future fistula formation.   After this process, the urologists removed the stents and left the foley in place.  The 16mm trochar site was closed with the inlet closure device and 2-0 vicryl.  The pneumoperitoneum was deflated and the remaining 39mm trochars were removed.  The skin was again closed with 4-0 monocryl and covered with surgical glue.    The patient was extubated and brought to PACU in stable condition.   Instrument, needle, and sponge counts were correct.  The patient was given a second dose of antibiotics during the bladder repair.    ---- Ranae Plumber, MD Attending Obstetrician and Gynecologist [facility] [place]

## 2020-03-31 NOTE — H&P (Signed)
Day-of-Surgery Preoperative History and Physical   [name] is a 54 y.o. here for surgical management of dysfunctional uterine bleeding, fibroid uterus.   Preoperative concerns have been addressed.  She has trialed other interventions without success.   10/2019 TV Ultrasound   Uterus 8 x 6 x 6 cm  With large fundal mass measuring 5.5 x 5 x 6 cm  EE 10 mm Ovaries not visualized   IMPRESSION:  1. Dominant uterine fibroid measuring up to 6 cm.  2. Normal premenopausal endometrial thickness.  3. Nonvisualized ovaries    PAP: NILM, HPV neg EMB: negative for malignancy or hyperplasia  Proposed surgery: total laparoscopic hysterectomy, bilateral salpingectomy  Past Medical History:  Diagnosis Date  . Chest pain 2011  . Diabetes mellitus, type 2 (HCC)   . Fatty liver   . HTN (hypertension)   . Hypoglycemia   . Hypokalemia   . Plantar fasciitis of right foot   . PONV (postoperative nausea and vomiting)    Past Surgical History:  Procedure Laterality Date  . BREAST BIOPSY Left 2012   benign   OB History  No obstetric history on file.  Patient denies any other pertinent gynecologic issues.   No current facility-administered medications on file prior to encounter.   Current Outpatient Medications on File Prior to Encounter  Medication Sig Dispense Refill  . dapagliflozin propanediol (FARXIGA) 5 MG TABS tablet Take 1 tablet (5 mg total) by mouth daily. 30 tablet 3  . escitalopram (LEXAPRO) 5 MG tablet Take 2 tablets po qpm (Patient taking differently: Take 10 mg by mouth every evening. Take 2 tablets po qpm) 60 tablet 3  . losartan (COZAAR) 50 MG tablet Take 1.5 tablets (75 mg total) by mouth daily. 45 tablet 3  . Accu-Chek Softclix Lancets lancets Use as instructed one a day e11.65 100 each 1  . glucose blood (ACCU-CHEK GUIDE) test strip Use as instructed Once a day dx e11.65 100 each 1  . OVER THE COUNTER MEDICATION Take 1 capsule by mouth in the morning and at bedtime. Liver  support     No Known Allergies  Social History:   reports that she has never smoked. She has never used smokeless tobacco. She reports current alcohol use of about 1.0 standard drink of alcohol per week. She reports that she does not use drugs.  Family History  Problem Relation Age of Onset  . Heart disease Mother   . Coronary artery disease Mother 62  . Heart failure Mother        chf  . Diabetes Mother   . Heart disease Father        Defibrillator  . Diabetes Father   . Diabetes Sister     Review of Systems: Noncontributory  PHYSICAL EXAM: Blood pressure (!) 146/78, pulse 82, temperature (!) 97.5 F (36.4 C), temperature source Temporal, resp. rate 16, height 5\' 5"  (1.651 m), weight 95 kg, SpO2 98 %. General appearance - alert, well appearing, and in no distress Chest - clear to auscultation, symmetric air entry, normal respiratory effort Heart - normal rate and regular rhythm Abdomen - soft, nontender, non-distended Pelvic - examination not performed in preop area Extremities - peripheral pulses normal, no pedal edema, no clubbing or cyanosis  Labs: Results for orders placed or performed during the hospital encounter of 03/31/20 (from the past 336 hour(s))  Pregnancy, urine POC   Collection Time: 03/31/20  9:48 AM  Result Value Ref Range   Preg Test, Ur NEGATIVE  NEGATIVE  Glucose, capillary   Collection Time: 03/31/20  9:52 AM  Result Value Ref Range   Glucose-Capillary 138 (H) 70 - 99 mg/dL  CBC   Collection Time: 03/31/20 10:14 AM  Result Value Ref Range   WBC 8.5 4.0 - 10.5 K/uL   RBC 4.53 3.87 - 5.11 MIL/uL   Hemoglobin 13.0 12.0 - 15.0 g/dL   HCT 09.3 26.7 - 12.4 %   MCV 87.9 80.0 - 100.0 fL   MCH 28.7 26.0 - 34.0 pg   MCHC 32.7 30.0 - 36.0 g/dL   RDW 58.0 99.8 - 33.8 %   Platelets 247 150 - 400 K/uL   nRBC 0.0 0.0 - 0.2 %  Results for orders placed or performed during the hospital encounter of 03/27/20 (from the past 336 hour(s))  SARS CORONAVIRUS 2  (TAT 6-24 HRS) Nasopharyngeal Nasopharyngeal Swab   Collection Time: 03/27/20 10:38 AM   Specimen: Nasopharyngeal Swab  Result Value Ref Range   SARS Coronavirus 2 NEGATIVE NEGATIVE    Imaging Studies: No results found.  Assessment: Patient Active Problem List   Diagnosis Date Noted  . Preprocedural examination 03/16/2020  . Type 2 diabetes mellitus with hyperglycemia, without long-term current use of insulin (HCC) 02/03/2020  . Calculus of gallbladder without cholecystitis without obstruction 02/03/2020  . Unspecified menopausal and perimenopausal disorder 11/18/2019  . Abnormal liver function 11/13/2019  . Irregular periods/menstrual cycles 11/13/2019  . Impaired fasting glucose 11/13/2019  . Thrombocytopenia (HCC) 11/13/2019  . Uterine leiomyoma 11/13/2019  . Encounter for general adult medical examination with abnormal findings 12/24/2018  . Urinary tract infection without hematuria 12/24/2018  . Atopic dermatitis 01/17/2018  . Screening for breast cancer 09/21/2017  . Depression, major, single episode, mild (HCC) 08/07/2017  . Abnormal weight gain 08/07/2017  . Dysuria 08/07/2017  . Essential hypertension   . Chest pain     Plan: Patient will undergo surgical management with total laparoscopic hysterectomy, bilateral salpingectomy.   The risks of surgery were discussed in detail with the patient including but not limited to: bleeding which may require transfusion or reoperation; infection which may require antibiotics; injury to surrounding organs which may involve bowel, bladder, ureters ; need for additional procedures including laparoscopy or laparotomy; thromboembolic phenomenon, surgical site problems and other postoperative/anesthesia complications. Likelihood of success in alleviating the patient's condition was discussed. Routine postoperative instructions will be reviewed with the patient and her family in detail after surgery.  The patient concurred with the proposed  plan, giving informed written consent for the surgery.  Patient has been NPO since last night she will remain NPO for procedure.  Anesthesia and OR aware.  Preoperative prophylactic antibiotics and SCDs ordered on call to the OR.  To OR when ready.  ----- Ranae Plumber, MD, FACOG Attending Obstetrician and Gynecologist [facility], Department of OB/GYN [facility]  03/31/2020 10:39 AM

## 2020-03-31 NOTE — Op Note (Signed)
Preoperative diagnosis:  Cystotomy s/p total laparoscopic hysterectomy with bilateral salpingectomy  Postoperative diagnosis:  Same   Procedure: Cystoscopy with bilateral ureteral catheterization  Surgeon: Riki Altes, MD  Assistant: Alfredo Martinez, MD  Anesthesia: General  Complications: None  Intraoperative findings:  1.  < 5 mm full-thickness cystotomy inferoposterior wall just to right of midline 2.  Superior and lateral to the above cystotomy a <1 mm cystotomy was also identified 3.  Ureteral orifices normal-appearing and after IV for fluorescein with bilateral efflux noted 4.  Cystoscopy post closure showed excellent closure both intravesically and via laparoscopy; no leakage noted on bladder filling 5.  Removal of ureteral catheters post closure showed bilateral fluorescein  tinged efflux  EBL: Minimal  Specimens: None  Intraoperative consultation: [name] is a 54 y.o. female status post total laparoscopic hysterectomy/BSO by Dr. Elesa Massed and cystotomy was noted on cystoscopy post procedure.  The defect was noted as described above.  Repair was recommended and I asked my colleague Dr. Sherron Monday to also assess and he agreed with repair.  We offered to perform open repair however Dr. Elesa Massed was comfortable with laparoscopic closure with our observation  Description of procedure:  A timeout was performed and a 21 French cystoscope was lubricated and passed per urethra.  Panendoscopy was performed with findings as described above.  A 0.038 Sensor wire was placed through the cystoscope and into the left ureteral orifice without difficulty a 5 [place] open-ended ureteral catheter was then placed over the wire and advanced to the 20 cm mark.  The guidewire was removed and a right 5 [place] ureteral catheter was placed in a similar fashion.  Dr. Elesa Massed performed laparoscopy and both defects were easily visualized.  She performed an imbricated laparoscopic closure which has been  dictated separately.  After closure was complete repeat cystoscopy was performed with findings as described above.  She then performed an omental overlap with excellent placement.  An 85 French Foley catheter was placed to gravity drainage.  Recommendation: Extended release oxybutynin 5-10 mg daily for bladder spasms Indwelling Foley catheter for 10-14 days with low pressure cystogram prior to catheter removal We will be happy to see the patient for cystogram and catheter removal   Riki Altes, M.D.

## 2020-03-31 NOTE — Transfer of Care (Signed)
Immediate Anesthesia Transfer of Care Note  Patient: [name]  Procedure(s) Performed: TOTAL LAPAROSCOPIC HYSTERECTOMY WITH BILATERAL SALPINGECTOMY (Bilateral ) CYSTOSCOPY WITH STENT REPLACEMENT (Bilateral )  Patient Location: PACU  Anesthesia Type:General  Level of Consciousness: drowsy and patient cooperative  Airway & Oxygen Therapy: Patient Spontanous Breathing and Patient connected to face mask oxygen  Post-op Assessment: Report given to RN and Post -op Vital signs reviewed and stable  Post vital signs: Reviewed  Last Vitals:  Vitals Value Taken Time  BP 127/66 03/31/20 1717  Temp    Pulse 86 03/31/20 1724  Resp 20 03/31/20 1724  SpO2 95 % 03/31/20 1724  Vitals shown include unvalidated device data.  Last Pain:  Vitals:   03/31/20 1006  TempSrc: Temporal  PainSc: 0-No pain         Complications: No complications documented.

## 2020-03-31 NOTE — Anesthesia Procedure Notes (Signed)
Procedure Name: Intubation Date/Time: 03/31/2020 12:03 PM Performed by: Henrietta Hoover, CRNA Pre-anesthesia Checklist: Patient identified, Emergency Drugs available, Suction available and Patient being monitored Patient Re-evaluated:Patient Re-evaluated prior to induction Oxygen Delivery Method: Circle system utilized Preoxygenation: Pre-oxygenation with 100% oxygen Induction Type: IV induction and Cricoid Pressure applied Ventilation: Mask ventilation without difficulty Laryngoscope Size: 3 and McGraph Grade View: Grade I Tube type: Oral Tube size: 7.0 mm Number of attempts: 1 Airway Equipment and Method: Stylet and Video-laryngoscopy Placement Confirmation: ETT inserted through vocal cords under direct vision,  positive ETCO2 and breath sounds checked- equal and bilateral Secured at: 20 cm Tube secured with: Tape Dental Injury: Teeth and Oropharynx as per pre-operative assessment

## 2020-04-01 ENCOUNTER — Encounter: Payer: Self-pay | Admitting: Obstetrics & Gynecology

## 2020-04-01 DIAGNOSIS — D259 Leiomyoma of uterus, unspecified: Secondary | ICD-10-CM | POA: Diagnosis not present

## 2020-04-01 LAB — CBC
HCT: 35.8 % — ABNORMAL LOW (ref 36.0–46.0)
Hemoglobin: 11.5 g/dL — ABNORMAL LOW (ref 12.0–15.0)
MCH: 28.7 pg (ref 26.0–34.0)
MCHC: 32.1 g/dL (ref 30.0–36.0)
MCV: 89.3 fL (ref 80.0–100.0)
Platelets: 303 10*3/uL (ref 150–400)
RBC: 4.01 MIL/uL (ref 3.87–5.11)
RDW: 13.3 % (ref 11.5–15.5)
WBC: 12.1 10*3/uL — ABNORMAL HIGH (ref 4.0–10.5)
nRBC: 0 % (ref 0.0–0.2)

## 2020-04-01 LAB — BASIC METABOLIC PANEL
Anion gap: 8 (ref 5–15)
BUN: 13 mg/dL (ref 6–20)
CO2: 22 mmol/L (ref 22–32)
Calcium: 8.1 mg/dL — ABNORMAL LOW (ref 8.9–10.3)
Chloride: 106 mmol/L (ref 98–111)
Creatinine, Ser: 0.78 mg/dL (ref 0.44–1.00)
GFR, Estimated: >60 mL/min (ref >60)
Glucose, Bld: 193 mg/dL — ABNORMAL HIGH (ref 70–99)
Potassium: 4 mmol/L (ref 3.5–5.1)
Sodium: 136 mmol/L (ref 135–145)

## 2020-04-01 MED ORDER — OXYCODONE HCL 5 MG PO TABS: 5.0000 mg | ORAL_TABLET | ORAL | 0 refills | Status: DC | PRN

## 2020-04-01 MED ORDER — PHENAZOPYRIDINE HCL 200 MG PO TABS: 200.0000 mg | ORAL_TABLET | Freq: Three times a day (TID) | ORAL | 0 refills | Status: DC

## 2020-04-01 MED ORDER — NITROFURANTOIN MONOHYD MACRO 100 MG PO CAPS: 100.0000 mg | ORAL_CAPSULE | Freq: Every day | ORAL | 0 refills | Status: AC

## 2020-04-01 MED ORDER — IBUPROFEN 600 MG PO TABS: 600.0000 mg | ORAL_TABLET | Freq: Four times a day (QID) | ORAL | 0 refills | Status: DC

## 2020-04-01 NOTE — Discharge Instructions (Signed)
Discharge instructions:  Call office if you have any of the following: fever >101 F, chills, shortness of breath, excessive vaginal bleeding, incision drainage or problems, leg pain or redness, or any other concerns.   Activity: Do not lift > 20 lbs for 8 weeks.  No intercourse or tampons for 8 weeks.  No driving until you are certain you can slam on the brakes, and of course never while taking narcotics.   You may feel some pain in your upper right abdomen/rib and right shoulder.  This is from the gas in the abdomen for surgery. This will subside over time, please be patient!  Take 600mg  Ibuprofen and 1000mg  Tylenol together, around the clock, every 6 hours for at least the first 3-5 days.  After this you can take as needed.  This will help decrease inflammation and promote healing.  The narcotics you'll take just as needed, as they just trick your brain into thinking its not in pain.    Please don't limit yourself in terms of routine activity.  You will be able to do most things, although they may take longer to do or be a little painful.  You can do it!  Don't be a hero, but don't be a wimp either!    Total Laparoscopic Hysterectomy, Care After This sheet gives you information about how to care for yourself after your procedure. Your health care provider may also give you more specific instructions. If you have problems or questions, contact your health care provider. What can I expect after the procedure? After the procedure, it is common to have:  Pain and bruising around your incisions.  A sore throat, if a breathing tube was used during surgery.  Fatigue.  Poor appetite.  Less interest in sex. If your ovaries were also removed, it is also common to have symptoms of menopause such as hot flashes, night sweats, and lack of sleep (insomnia). Follow these instructions at home: Bathing  Do not take baths, swim, or use a hot tub until your health care provider approves. You may  need to only take showers for 2-3 weeks.  Keep your bandage (dressing) dry until your health care provider says it can be removed. Incision care   Follow instructions from your health care provider about how to take care of your incisions. Make sure you: ? Wash your hands with soap and water before you change your dressing. If soap and water are not available, use hand sanitizer. ? Change your dressing as told by your health care provider. ? Leave stitches (sutures), skin glue, or adhesive strips in place. These skin closures may need to stay in place for 2 weeks or longer. If adhesive strip edges start to loosen and curl up, you may trim the loose edges. Do not remove adhesive strips completely unless your health care provider tells you to do that.  Check your incision area every day for signs of infection. Check for: ? Redness, swelling, or pain. ? Fluid or blood. ? Warmth. ? Pus or a bad smell. Activity  Get plenty of rest and sleep.  Do not lift anything that is heavier than 10 lbs (4.5 kg) for one month after surgery, or as long as told by your health care provider.  Do not drive or use heavy machinery while taking prescription pain medicine.  Do not drive for 24 hours if you were given a medicine to help you relax (sedative).  Return to your normal activities as told by your health  care provider. Ask your health care provider what activities are safe for you. Lifestyle   Do not use any products that contain nicotine or tobacco, such as cigarettes and e-cigarettes. These can delay healing. If you need help quitting, ask your health care provider.  Do not drink alcohol until your health care provider approves. General instructions  Do not douche, use tampons, or have sex for at least 6 weeks, or as told by your health care provider.  Take over-the-counter and prescription medicines only as told by your health care provider.  To monitor yourself for a fever, take your  temperature at least once a day during recovery.  If you struggle with physical or emotional changes after your procedure, speak with your health care provider or a therapist.  To prevent or treat constipation while you are taking prescription pain medicine, your health care provider may recommend that you: ? Drink enough fluid to keep your urine clear or pale yellow. ? Take over-the-counter or prescription medicines. ? Eat foods that are high in fiber, such as fresh fruits and vegetables, whole grains, and beans. ? Limit foods that are high in fat and processed sugars, such as fried and sweet foods.  Keep all follow-up visits as told by your health care provider. This is important. Contact a health care provider if:  You have chills or a fever.  You have redness, swelling, or pain around an incision.  You have fluid or blood coming from an incision.  Your incision feels warm to the touch.  You have pus or a bad smell coming from an incision.  An incision breaks open.  You feel dizzy or light-headed.  You have pain or bleeding when you urinate.  You have diarrhea, nausea, or vomiting that does not go away.  You have abnormal vaginal discharge.  You have a rash.  You have pain that does not get better with medicine. Get help right away if:  You have a fever and your symptoms suddenly get worse.  You have severe abdominal pain.  You have chest pain.  You have shortness of breath.  You faint.  You have pain, swelling, or redness on your leg.  You have heavy vaginal bleeding with blood clots. Summary  After the procedure it is common to have abdominal pain. Your provider will give you medication for this.  Do not take baths, swim, or use a hot tub until your health care provider approves.  Do not lift anything that is heavier than 10 lbs (4.5 kg) for one month after surgery, or as long as told by your health care provider.  Notify your provider if you have any  signs or symptoms of infection after the procedure. This information is not intended to replace advice given to you by your health care provider. Make sure you discuss any questions you have with your health care provider. Document Revised: 03/04/2017 Document Reviewed: 06/02/2016 Elsevier Patient Education  2020 [employer].

## 2020-04-01 NOTE — Progress Notes (Signed)
Urology Consult Follow Up  Subjective: Mild bladder discomfort but tolerating catheter well  Anti-infectives: Anti-infectives (From admission, onward)   Start     Dose/Rate Route Frequency Ordered Stop   04/01/20 0000  nitrofurantoin, macrocrystal-monohydrate, (MACROBID) 100 MG capsule        100 mg Oral Daily 04/01/20 1342 04/15/20 2359   03/31/20 1000  ceFAZolin (ANCEF) IVPB 2g/100 mL premix        2 g 200 mL/hr over 30 Minutes Intravenous On call to O.R. 03/31/20 0947 03/31/20 1600   03/31/20 0959  ceFAZolin (ANCEF) 2-4 GM/100ML-% IVPB       Note to Pharmacy: Mike Craze   : cabinet override      03/31/20 0959 03/31/20 1231      Current Facility-Administered Medications  Medication Dose Route Frequency Provider Last Rate Last Admin  . docusate sodium (COLACE) capsule 100 mg  100 mg Oral BID Ward, Elenora Fender, MD   100 mg at 04/01/20 0830  . ibuprofen (ADVIL) tablet 600 mg  600 mg Oral Q6H PRN Ward, Elenora Fender, MD   600 mg at 04/01/20 0831  . menthol-cetylpyridinium (CEPACOL) lozenge 3 mg  1 lozenge Oral Q2H PRN Ward, Chelsea C, MD      . ondansetron (ZOFRAN) tablet 4 mg  4 mg Oral Q6H PRN Ward, Elenora Fender, MD       Or  . ondansetron (ZOFRAN) injection 4 mg  4 mg Intravenous Q6H PRN Ward, Chelsea C, MD      . oxybutynin (DITROPAN-XL) 24 hr tablet 10 mg  10 mg Oral QHS Ward, Elenora Fender, MD   10 mg at 03/31/20 2115  . oxyCODONE (Oxy IR/ROXICODONE) immediate release tablet 5-10 mg  5-10 mg Oral Q4H PRN Ward, Elenora Fender, MD   10 mg at 04/01/20 1058  . phenazopyridine (PYRIDIUM) tablet 200 mg  200 mg Oral TID WC Ward, Elenora Fender, MD   200 mg at 04/01/20 1239  . scopolamine (TRANSDERM-SCOP) 1 MG/3DAYS 1.5 mg  1 patch Transdermal On Call to OR Ward, Elenora Fender, MD   1.5 mg at 03/31/20 1034  . scopolamine (TRANSDERM-SCOP) 1 MG/3DAYS 1.5 mg  1 patch Transdermal Once Verlee Monte, NP      . senna-docusate (Senokot-S) tablet 1 tablet  1 tablet Oral QHS PRN Ward, Elenora Fender, MD      . simethicone  (MYLICON) chewable tablet 160 mg  160 mg Oral QID PRN Ward, Elenora Fender, MD       Current Outpatient Medications  Medication Sig Dispense Refill  . dapagliflozin propanediol (FARXIGA) 5 MG TABS tablet Take 1 tablet (5 mg total) by mouth daily. 30 tablet 3  . escitalopram (LEXAPRO) 5 MG tablet Take 2 tablets po qpm (Patient taking differently: Take 10 mg by mouth every evening. Take 2 tablets po qpm) 60 tablet 3  . losartan (COZAAR) 50 MG tablet Take 1.5 tablets (75 mg total) by mouth daily. 45 tablet 3  . nitrofurantoin, macrocrystal-monohydrate, (MACROBID) 100 MG capsule Take 1 capsule (100 mg total) by mouth daily for 14 days. While foley is in place 14 capsule 0  . Accu-Chek Softclix Lancets lancets Use as instructed one a day e11.65 100 each 1  . Chlorpheniramine-Acetaminophen (CORICIDIN HBP COLD/FLU PO) Take 1 tablet by mouth 2 (two) times daily as needed. (Patient not taking: Reported on 03/31/2020)    . glucose blood (ACCU-CHEK GUIDE) test strip Use as instructed Once a day dx e11.65 100 each 1  . ibuprofen (ADVIL) 600  MG tablet Take 1 tablet (600 mg total) by mouth every 6 (six) hours. 45 tablet 0  . OVER THE COUNTER MEDICATION Take 1 capsule by mouth in the morning and at bedtime. Liver support    . oxyCODONE (OXY IR/ROXICODONE) 5 MG immediate release tablet Take 1-2 tablets (5-10 mg total) by mouth every 4 (four) hours as needed for moderate pain. 28 tablet 0  . phenazopyridine (PYRIDIUM) 200 MG tablet Take 1 tablet (200 mg total) by mouth 3 (three) times daily with meals. 42 tablet 0     Objective: Vital signs in last 24 hours: Temp:  [97.4 F (36.3 C)-98.9 F (37.2 C)] 98 F (36.7 C) (12/28 1227) Pulse Rate:  [57-90] 63 (12/28 1227) Resp:  [12-22] 18 (12/28 1227) BP: (111-139)/(61-78) 132/67 (12/28 1227) SpO2:  [89 %-97 %] 90 % (12/28 0824)  Intake/Output from previous day: 12/27 0701 - 12/28 0700 In: 2010 [I.V.:2000; IV Piggyback:10] Out: 2205 [Urine:2075;  Blood:130] Intake/Output this shift: Total I/O In: -  Out: 1850 [Urine:1850]   Physical Exam: Foley catheter draining clear urine  Lab Results:  Recent Labs    03/31/20 1014 04/01/20 0329  WBC 8.5 12.1*  HGB 13.0 11.5*  HCT 39.8 35.8*  PLT 247 303   BMET Recent Labs    03/31/20 1014 04/01/20 0329  NA 138 136  K 3.5 4.0  CL 104 106  CO2 23 22  GLUCOSE 151* 193*  BUN 10 13  CREATININE 0.66 0.78  CALCIUM 8.7* 8.1*    Assessment: s/p Procedure(s): TOTAL LAPAROSCOPIC HYSTERECTOMY WITH BILATERAL SALPINGECTOMY CYSTOSCOPY WITH STENT REPLACEMENT  Recommendation:   Okay for discharge from urologic standpoint  Will schedule cystogram 10-14 days and follow-up in office for catheter removal  Continue oxybutynin    LOS: 0 days    Abbie Sons 04/01/2020

## 2020-04-01 NOTE — Progress Notes (Signed)
Patient discharged home with spouse. Discharge instructions and prescriptions given and reviewed with patient. Foley catheter standard and leg bag teaching completed by teach back. Urology office to call pt to schedule f/u, OB f/u Apr 15, 2020. Patient verbalized understanding. Escorted out by auxillary.

## 2020-04-02 ENCOUNTER — Encounter: Payer: Self-pay | Admitting: Obstetrics & Gynecology

## 2020-04-02 LAB — SURGICAL PATHOLOGY

## 2020-04-02 NOTE — Anesthesia Preprocedure Evaluation (Signed)
Anesthesia Evaluation  Patient identified by MRN, date of birth, ID band Patient awake    Reviewed: Allergy & Precautions, NPO status , Patient's Chart, lab work & pertinent test results  History of Anesthesia Complications (+) PONV and history of anesthetic complications  Airway Mallampati: III  TM Distance: <3 FB     Dental  (+) Teeth Intact   Pulmonary neg pulmonary ROS,    Pulmonary exam normal        Cardiovascular hypertension, Normal cardiovascular exam     Neuro/Psych PSYCHIATRIC DISORDERS Depression    GI/Hepatic Neg liver ROS,   Endo/Other  diabetes  Renal/GU negative Renal ROS  negative genitourinary   Musculoskeletal negative musculoskeletal ROS (+)   Abdominal Normal abdominal exam  (+)   Peds negative pediatric ROS (+)  Hematology negative hematology ROS (+)   Anesthesia Other Findings Past Medical History: 2011: Chest pain No date: Diabetes mellitus, type 2 (HCC) No date: Fatty liver No date: HTN (hypertension) No date: Hypoglycemia No date: Hypokalemia No date: Plantar fasciitis of right foot No date: PONV (postoperative nausea and vomiting)  Reproductive/Obstetrics                             Anesthesia Physical Anesthesia Plan  ASA: III  Anesthesia Plan: General   Post-op Pain Management:    Induction: Intravenous  PONV Risk Score and Plan:   Airway Management Planned: Oral ETT  Additional Equipment:   Intra-op Plan:   Post-operative Plan: Extubation in OR  Informed Consent: I have reviewed the patients History and Physical, chart, labs and discussed the procedure including the risks, benefits and alternatives for the proposed anesthesia with the patient or authorized representative who has indicated his/her understanding and acceptance.     Dental advisory given  Plan Discussed with: CRNA and Surgeon  Anesthesia Plan Comments:          Anesthesia Quick Evaluation

## 2020-04-02 NOTE — Anesthesia Postprocedure Evaluation (Signed)
Anesthesia Post Note  Patient: [name]  Procedure(s) Performed: TOTAL LAPAROSCOPIC HYSTERECTOMY WITH BILATERAL SALPINGECTOMY (Bilateral ) CYSTOSCOPY WITH STENT REPLACEMENT (Bilateral )  Patient location during evaluation: PACU Anesthesia Type: General Level of consciousness: awake and alert and oriented Pain management: pain level controlled Vital Signs Assessment: post-procedure vital signs reviewed and stable Respiratory status: spontaneous breathing Cardiovascular status: blood pressure returned to baseline Anesthetic complications: no   No complications documented.   Last Vitals:  Vitals:   04/01/20 0824 04/01/20 1227  BP: 111/67 132/67  Pulse: 67 63  Resp: 18 18  Temp: 37.2 C 36.7 C  SpO2: 90%     Last Pain:  Vitals:   04/01/20 1227  TempSrc: Oral  PainSc:                  Bandon Sherwin

## 2020-04-03 ENCOUNTER — Telehealth: Payer: Self-pay

## 2020-04-03 DIAGNOSIS — Z90710 Acquired absence of both cervix and uterus: Secondary | ICD-10-CM

## 2020-04-03 MED ORDER — OXYBUTYNIN CHLORIDE ER 10 MG PO TB24: 10.0000 mg | ORAL_TABLET | Freq: Every day | ORAL | 0 refills | Status: DC

## 2020-04-03 NOTE — Telephone Encounter (Signed)
Patient notified and verbalized understanding. 

## 2020-04-03 NOTE — Telephone Encounter (Signed)
Patient called stating that she had a hysterectomy on Monday and Dr. Lonna Cobb did a bladder repair during her case. She is having leakage from her catheter and would like to know if this is normal. She confirms she is having drainage into her bag and denies blood. It was explained that she is experiencing bladder spasms which is normal with a cath in place. She denies any pain or discomfort with spasms. She verbalized understanding that as long as urine continues to drain into the bag leakage around the catheter is ok and most likely due to spasms. If she has pain or discomfort with spasms she will call back. Patient also asked for a follow up to be scheduled for cath removal. This was scheduled for 10days post op.

## 2020-04-03 NOTE — Telephone Encounter (Signed)
I've placed an order for a cystogram today, aiming for the morning of 1/7 or 1/10. She may follow-up in our clinic later on the same day for possible Foley removal per cystogram results.  Additionally, I have sent a prescription for oxybutynin XL 10mg  daily to [facility]. She should start this today to reduce bladder spasms.

## 2020-04-10 ENCOUNTER — Ambulatory Visit (INDEPENDENT_AMBULATORY_CARE_PROVIDER_SITE_OTHER): Payer: No Typology Code available for payment source | Admitting: Physician Assistant

## 2020-04-10 ENCOUNTER — Other Ambulatory Visit: Payer: Self-pay

## 2020-04-10 ENCOUNTER — Ambulatory Visit
Admission: RE | Admit: 2020-04-10 | Discharge: 2020-04-10 | Disposition: A | Discharge status: Home / Self Care | Payer: No Typology Code available for payment source | Source: Ambulatory Visit | Attending: Physician Assistant | Admitting: Physician Assistant

## 2020-04-10 DIAGNOSIS — Z90710 Acquired absence of both cervix and uterus: Secondary | ICD-10-CM

## 2020-04-10 MED ORDER — IOTHALAMATE MEGLUMINE 17.2 % UR SOLN
250.0000 mL | Freq: Once | URETHRAL | Status: AC | PRN
  Administered 2020-04-10: 300 mL via INTRAVESICAL

## 2020-04-10 NOTE — Progress Notes (Signed)
Catheter Removal  Patient is present today for a catheter removal.  59ml of water was drained from the balloon. A 18FR foley cath was removed from the bladder no complications were noted . Patient tolerated well.  Performed by: Carman Ching, PA-C

## 2020-04-10 NOTE — Progress Notes (Signed)
04/10/2020 10:20 AM   [name] [birth year] [id number]  CC: Chief Complaint  Patient presents with  . Other    Voiding trial    HPI: [name] is a 55 y.o. female s/p total laparoscopic hysterectomy with bilateral salpingectomy requiring intraoperative cystotomy repair on 03/31/2020.  Dr. Lonna Cobb was consulted intraoperatively for cystoscopy with temporary bilateral ureteral catheterization.  Today she reports feeling well after surgery.  She is looking forward to Foley catheter removal.  Cystogram performed this morning with no leak noted.  She denies any urologic history.  PMH: Past Medical History:  Diagnosis Date  . Chest pain 2011  . Diabetes mellitus, type 2 (HCC)   . Fatty liver   . HTN (hypertension)   . Hypoglycemia   . Hypokalemia   . Plantar fasciitis of right foot   . PONV (postoperative nausea and vomiting)     Surgical History: Past Surgical History:  Procedure Laterality Date  . BREAST BIOPSY Left 2012   benign  . CYSTOSCOPY W/ URETERAL STENT PLACEMENT Bilateral 03/31/2020   Procedure: CYSTOSCOPY WITH STENT REPLACEMENT;  Surgeon: Riki Altes, MD;  Location: [facility];  Service: Urology;  Laterality: Bilateral;  . TOTAL LAPAROSCOPIC HYSTERECTOMY WITH SALPINGECTOMY Bilateral 03/31/2020   Procedure: TOTAL LAPAROSCOPIC HYSTERECTOMY WITH BILATERAL SALPINGECTOMY;  Surgeon: Ward, Elenora Fender, MD;  Location: [facility] OR[facility];  Service: Gynecology;  Laterality: Bilateral;    Home Medications:  Allergies as of 04/10/2020   No Known Allergies     Medication List       Accurate as of April 10, 2020 10:20 AM. If you have any questions, ask your nurse or doctor.        Accu-Chek Guide test strip Generic drug: glucose blood Use as instructed Once a day dx e11.65   Accu-Chek Softclix Lancets lancets Use as instructed one a day e11.65   CORICIDIN HBP COLD/FLU PO Take 1 tablet by mouth 2 (two) times daily as needed.   dapagliflozin propanediol 5 MG Tabs  tablet Commonly known as: Farxiga Take 1 tablet (5 mg total) by mouth daily.   escitalopram 5 MG tablet Commonly known as: LEXAPRO Take 2 tablets po qpm What changed:   how much to take  how to take this  when to take this   ibuprofen 600 MG tablet Commonly known as: ADVIL Take 1 tablet (600 mg total) by mouth every 6 (six) hours.   losartan 50 MG tablet Commonly known as: COZAAR Take 1.5 tablets (75 mg total) by mouth daily.   nitrofurantoin (macrocrystal-monohydrate) 100 MG capsule Commonly known as: Macrobid Take 1 capsule (100 mg total) by mouth daily for 14 days. While foley is in place   OVER THE COUNTER MEDICATION Take 1 capsule by mouth in the morning and at bedtime. Liver support   oxybutynin 10 MG 24 hr tablet Commonly known as: DITROPAN-XL Take 1 tablet (10 mg total) by mouth daily.   oxyCODONE 5 MG immediate release tablet Commonly known as: Oxy IR/ROXICODONE Take 1-2 tablets (5-10 mg total) by mouth every 4 (four) hours as needed for moderate pain.   phenazopyridine 200 MG tablet Commonly known as: PYRIDIUM Take 1 tablet (200 mg total) by mouth 3 (three) times daily with meals.       Allergies:  No Known Allergies  Family History: Family History  Problem Relation Age of Onset  . Heart disease Mother   . Coronary artery disease Mother 7  . Heart failure Mother  chf  . Diabetes Mother   . Heart disease Father        Defibrillator  . Diabetes Father   . Diabetes Sister     Social History:   reports that she has never smoked. She has never used smokeless tobacco. She reports current alcohol use of about 1.0 standard drink of alcohol per week. She reports that she does not use drugs.  Physical Exam: There were no vitals taken for this visit.  Constitutional:  Alert and oriented, no acute distress, nontoxic appearing HEENT: [place], AT Cardiovascular: No clubbing, cyanosis, or edema Respiratory: Normal respiratory effort, no increased  work of breathing Skin: No rashes, bruises or suspicious lesions Neurologic: Grossly intact, no focal deficits, moving all 4 extremities Psychiatric: Normal mood and affect  Pertinent Imaging: Cystogram, 04/10/2020: CLINICAL DATA:  Status post hysterectomy and bladder surgery.  EXAM: CYSTOGRAM  TECHNIQUE: After catheterization of the urinary bladder following sterile technique the bladder was filled with 300 mL Cysto-Conray by drip infusion. Serial spot images were obtained during bladder filling and post draining.  FLUOROSCOPY TIME:  Fluoroscopy Time:  2 minutes 24 seconds  Radiation Exposure Index (if provided by the fluoroscopic device): 147.9 mGy  COMPARISON:  Ultrasound 10/30/2019.  FINDINGS: Scout film reveals calcifications in the right upper quadrant suggesting gallstones. Pelvic calcifications are noted consistent with phleboliths. No bowel distention or free air.  300 cc of contrast was introduced through the Foley catheter into the bladder without difficulty. The bladder distends normally. No leak noted. Slight irregularity of the left posterior lower bladder wall noted. This may be from prior surgery. Clinical correlation suggested. Postvoid views are normal. Again noted leak noted. Patient was discharged with Foley in place.  IMPRESSION: No evidence of bladder leak. Slight irregularity noted the left posterior lower bladder wall. This may be from prior surgery. Clinical correlation suggested.   Electronically Signed   By: Maisie Fus  Register   On: 04/10/2020 09:45  I personally reviewed the images referenced above and note no bladder leak.  Assessment & Plan:   1. S/P hysterectomy No bladder leak on cystogram this morning per radiology review, personally review, and in consultation with Dr. Lonna Cobb.  Okay for Foley catheter removal.  Foley catheter removed in clinic today, see separate procedure note for details.  No further urologic care required  at this point, patient to follow-up as needed.  Return if symptoms worsen or fail to improve.  Donna Ching, PA-C  [facility] [address] [place], [place] [address] [phone]

## 2020-04-14 NOTE — Discharge Summary (Addendum)
Gynecology Discharge Summary  Patient ID: [name] MRN: [id number] DOB/AGE: 06/06/1965 55 y.o.  Admit Date: 03/31/2020 Discharge Date: 04/14/2020  Preoperative Diagnoses:  Fibroid uterus, menorrhagia Postoperative Diagnoses: same, with cystotomy   Procedures: Procedure(s) (LRB): TOTAL LAPAROSCOPIC HYSTERECTOMY WITH BILATERAL SALPINGECTOMY (Bilateral) CYSTOSCOPY WITH STENT REPLACEMENT (Bilateral)  CBC Latest Ref Rng & Units 04/01/2020 03/31/2020 02/19/2020  WBC 4.0 - 10.5 K/uL 12.1(H) 8.5 8.4  Hemoglobin 12.0 - 15.0 g/dL 11.5(L) 13.0 12.0  Hematocrit 36.0 - 46.0 % 35.8(L) 39.8 36.5  Platelets 150 - 400 K/uL 303 247 149(L)    Hospital Course:  [name] is a 55 y.o.  admitted for scheduled surgery.  She underwent the procedures as mentioned above, her operation was complicated by a small cystotomy which was identified and repaired intraoperatively. For further details about surgery, please refer to the operative report. Patient had an uncomplicated postoperative course. By time of discharge on POD#1, her pain was controlled on oral pain medications; she was ambulating, voiding without difficulty, tolerating regular diet and passing flatus. She was deemed stable for discharge to home.    Discharge Exam: BP 132/67 (BP Location: Right Arm)   Pulse 63   Temp 98 F (36.7 C) (Oral)   Resp 18   Ht 5\' 5"  (1.651 m)   Wt 95 kg   SpO2 90%   BMI 34.85 kg/m  General appearance: alert and no distress  Resp: clear to auscultation bilaterally, normal respiratory effort Cardio: regular rate and rhythm  GI: soft, non-tender; bowel sounds normal; no masses, no organomegaly.  Incision: C/D/I, no erythema, no drainage noted Pelvic: scant blood on pad  Extremities: extremities normal, atraumatic, no cyanosis or edema and Homans sign is negative, no sign of DVT  Foley in place; leg bag replaced.  Discharged Condition: Stable  Disposition: Discharge disposition: 01-Home or Self  Care        Allergies as of 04/01/2020   No Known Allergies     Medication List    TAKE these medications   Accu-Chek Guide test strip Generic drug: glucose blood Use as instructed Once a day dx e11.65   Accu-Chek Softclix Lancets lancets Use as instructed one a day e11.65   CORICIDIN HBP COLD/FLU PO Take 1 tablet by mouth 2 (two) times daily as needed.   dapagliflozin propanediol 5 MG Tabs tablet Commonly known as: Farxiga Take 1 tablet (5 mg total) by mouth daily.   escitalopram 5 MG tablet Commonly known as: LEXAPRO Take 2 tablets po qpm What changed:   how much to take  how to take this  when to take this   ibuprofen 600 MG tablet Commonly known as: ADVIL Take 1 tablet (600 mg total) by mouth every 6 (six) hours.   losartan 50 MG tablet Commonly known as: COZAAR Take 1.5 tablets (75 mg total) by mouth daily.   nitrofurantoin (macrocrystal-monohydrate) 100 MG capsule Commonly known as: Macrobid Take 1 capsule (100 mg total) by mouth daily for 14 days. While foley is in place   OVER THE COUNTER MEDICATION Take 1 capsule by mouth in the morning and at bedtime. Liver support   oxyCODONE 5 MG immediate release tablet Commonly known as: Oxy IR/ROXICODONE Take 1-2 tablets (5-10 mg total) by mouth every 4 (four) hours as needed for moderate pain.   phenazopyridine 200 MG tablet Commonly known as: PYRIDIUM Take 1 tablet (200 mg total) by mouth 3 (three) times daily with meals.       Follow-up Information  Hulbert Branscome, Honor Loh, MD. Go on 04/15/2020.   Specialty: Obstetrics and Gynecology Why: post-op follow up Contact information: [place] [address] [phone]        Abbie Sons, MD. Schedule an appointment as soon as possible for a visit.   Specialty: Urology Why: Office will call you for follow up appointment  Contact information: [address] [place] [address] [phone]                Signed:  Roscoe Attending [facility]

## 2020-05-01 ENCOUNTER — Other Ambulatory Visit: Payer: Self-pay

## 2020-05-01 DIAGNOSIS — I10 Essential (primary) hypertension: Secondary | ICD-10-CM

## 2020-05-01 MED ORDER — LOSARTAN POTASSIUM 50 MG PO TABS: 75.0000 mg | ORAL_TABLET | Freq: Every day | ORAL | 3 refills | Status: DC

## 2020-05-22 ENCOUNTER — Ambulatory Visit (INDEPENDENT_AMBULATORY_CARE_PROVIDER_SITE_OTHER): Payer: No Typology Code available for payment source | Admitting: Hospice and Palliative Medicine

## 2020-05-22 ENCOUNTER — Other Ambulatory Visit: Payer: Self-pay

## 2020-05-22 ENCOUNTER — Encounter: Payer: Self-pay | Admitting: Hospice and Palliative Medicine

## 2020-05-22 VITALS — BP 152/75 | HR 82 | Temp 97.7°F | Resp 16 | Ht 67.0 in | Wt 213.0 lb

## 2020-05-22 DIAGNOSIS — G479 Sleep disorder, unspecified: Secondary | ICD-10-CM | POA: Diagnosis not present

## 2020-05-22 DIAGNOSIS — E1165 Type 2 diabetes mellitus with hyperglycemia: Secondary | ICD-10-CM | POA: Diagnosis not present

## 2020-05-22 DIAGNOSIS — I10 Essential (primary) hypertension: Secondary | ICD-10-CM

## 2020-05-22 LAB — POCT GLYCOSYLATED HEMOGLOBIN (HGB A1C): Hemoglobin A1C: 7.3 % — AB (ref 4.0–5.6)

## 2020-05-22 MED ORDER — LOSARTAN POTASSIUM 100 MG PO TABS: 100.0000 mg | ORAL_TABLET | Freq: Every day | ORAL | 3 refills | Status: DC

## 2020-05-22 MED ORDER — DAPAGLIFLOZIN PROPANEDIOL 10 MG PO TABS: 10.0000 mg | ORAL_TABLET | Freq: Every day | ORAL | 1 refills | Status: DC

## 2020-05-22 NOTE — Progress Notes (Signed)
[facility] [place], [place] [address]  Internal MEDICINE  Office Visit Note  Patient Name: [name]  [id number]  [id number]  Date of Service: 05/25/2020  Chief Complaint  Patient presents with  . Follow-up  . Diabetes  . Hypertension  . Quality Metric Gaps    covid    HPI Patient is here for routine follow-up Recovering from total hysterectomy due to menorrhagia secondary to uterine fibroids, did suffer complication--bladder was clipped during hysterectomy, repaired and discharged home with indwelling catheter, has since been removed and has been cleared by urology as well as GYN Post operatively she was told that she was difficult to arouse and remained groggy from sedation, anaesthesiologist recommedned she discuss OSA testing with her PCP Last visit with me, diagnosed with T2Dm new onset, has been tolerating [place] well, home glucose readings have been stable Has been taking 5 mg Farxiga since diagnosis She has also significantly cut back on alcohol consumption BP-also noticed that during her procedures and follow-up appointments with surgeons her BP has been elevated Denies chest pain, headache or visual disturbances  EPWORTH SLEEPINESS SCALE:  Scale:  (0)= no chance of dozing; (1)= slight chance of dozing; (2)= moderate chance of dozing; (3)= high chance of dozing  Chance  Situtation    Sitting and reading: 2    Watching TV: 2    Sitting Inactive in public: 1    As a passenger in car: 1      Lying down to rest: 3    Sitting and talking: 0    Sitting quielty after lunch: 2    In a car, stopped in traffic: 1   TOTAL SCORE:   12 out of 24  Current Medication: Outpatient Encounter Medications as of 05/22/2020  Medication Sig  . dapagliflozin propanediol (FARXIGA) 10 MG TABS tablet Take 1 tablet (10 mg total) by mouth daily before breakfast.  . losartan (COZAAR) 100 MG tablet Take 1 tablet (100 mg total) by mouth daily.  .  Accu-Chek Softclix Lancets lancets Use as instructed one a day e11.65  . escitalopram (LEXAPRO) 5 MG tablet Take 2 tablets po qpm (Patient taking differently: Take 10 mg by mouth every evening. Take 2 tablets po qpm)  . glucose blood (ACCU-CHEK GUIDE) test strip Use as instructed Once a day dx e11.65  . ibuprofen (ADVIL) 600 MG tablet Take 1 tablet (600 mg total) by mouth every 6 (six) hours.  Marland Kitchen OVER THE COUNTER MEDICATION Take 1 capsule by mouth in the morning and at bedtime. Liver support  . phenazopyridine (PYRIDIUM) 200 MG tablet Take 1 tablet (200 mg total) by mouth 3 (three) times daily with meals.  . [DISCONTINUED] Chlorpheniramine-Acetaminophen (CORICIDIN HBP COLD/FLU PO) Take 1 tablet by mouth 2 (two) times daily as needed. (Patient not taking: Reported on 03/31/2020)  . [DISCONTINUED] dapagliflozin propanediol (FARXIGA) 5 MG TABS tablet Take 1 tablet (5 mg total) by mouth daily.  . [DISCONTINUED] losartan (COZAAR) 50 MG tablet Take 1.5 tablets (75 mg total) by mouth daily.  . [DISCONTINUED] oxybutynin (DITROPAN-XL) 10 MG 24 hr tablet Take 1 tablet (10 mg total) by mouth daily.  . [DISCONTINUED] oxyCODONE (OXY IR/ROXICODONE) 5 MG immediate release tablet Take 1-2 tablets (5-10 mg total) by mouth every 4 (four) hours as needed for moderate pain.   No facility-administered encounter medications on file as of 05/22/2020.    Surgical History: Past Surgical History:  Procedure Laterality Date  . BREAST BIOPSY Left 2012  benign  . CYSTOSCOPY W/ URETERAL STENT PLACEMENT Bilateral 03/31/2020   Procedure: CYSTOSCOPY WITH STENT REPLACEMENT;  Surgeon: Abbie Sons, MD;  Location: [facility] ORS;  Service: Urology;  Laterality: Bilateral;  . TOTAL LAPAROSCOPIC HYSTERECTOMY WITH SALPINGECTOMY Bilateral 03/31/2020   Procedure: TOTAL LAPAROSCOPIC HYSTERECTOMY WITH BILATERAL SALPINGECTOMY;  Surgeon: Ward, Honor Loh, MD;  Location: [facility] ORS;  Service: Gynecology;  Laterality: Bilateral;    Medical  History: Past Medical History:  Diagnosis Date  . Chest pain 2011  . Diabetes mellitus, type 2 ([place])   . Fatty liver   . HTN (hypertension)   . Hypoglycemia   . Hypokalemia   . Plantar fasciitis of right foot   . PONV (postoperative nausea and vomiting)     Family History: Family History  Problem Relation Age of Onset  . Heart disease Mother   . Coronary artery disease Mother 45  . Heart failure Mother        chf  . Diabetes Mother   . Heart disease Father        Defibrillator  . Diabetes Father   . Diabetes Sister     Social History   Socioeconomic History  . Marital status: Married    Spouse name: Merry Proud  . Number of children: 3  . Years of education: Not on file  . Highest education level: Not on file  Occupational History  . 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 Smoking status: Never Smoker  . Smokeless tobacco: Never Used  Vaping Use  . Vaping Use: Never used  Substance and Sexual Activity  . Alcohol use: Yes    Alcohol/week: 1.0 standard drink    Types: 1 Glasses of wine per week    Comment: few times a week   . Drug use: Never  . Sexual activity: Not on file  Other Topics Concern  . Not on file  Social History Narrative  . Not on file   Social Determinants of Health   Financial Resource Strain: Not on file  Food Insecurity: Not on file  Transportation Needs: Not on file  Physical Activity: Not on file  Stress: Not on file  Social Connections: Not on file  Intimate Partner Violence: Not on file      Review of Systems  Constitutional: Negative for chills, diaphoresis and fatigue.  HENT: Negative for ear pain, postnasal drip and sinus pressure.   Eyes: Negative for photophobia, discharge, redness, itching and visual disturbance.  Respiratory: Negative for cough, shortness of breath and wheezing.   Cardiovascular: Negative for chest pain, palpitations and leg swelling.   Gastrointestinal: Negative for abdominal pain, constipation, diarrhea, nausea and vomiting.  Genitourinary: Negative for dysuria and flank pain.  Musculoskeletal: Negative for arthralgias, back pain, gait problem and neck pain.  Skin: Negative for color change.  Allergic/Immunologic: Negative for environmental allergies and food allergies.  Neurological: Negative for dizziness and headaches.  Hematological: Does not bruise/bleed easily.  Psychiatric/Behavioral: Negative for agitation, behavioral problems (depression) and hallucinations.    Vital Signs: BP (!) 152/75   Pulse 82   Temp 97.7 F (36.5 C)   Resp 16   Ht 5\' 7"  (1.702 m)   Wt 213 lb (96.6 kg)   SpO2 96%   BMI 33.36 kg/m    Physical Exam Vitals reviewed.  Constitutional:      Appearance: Normal appearance. She is obese.  Cardiovascular:     Rate and  Rhythm: Normal rate and regular rhythm.     Pulses: Normal pulses.     Heart sounds: Normal heart sounds.  Pulmonary:     Effort: Pulmonary effort is normal.     Breath sounds: Normal breath sounds.  Abdominal:     General: Abdomen is flat.     Palpations: Abdomen is soft.  Musculoskeletal:        General: Normal range of motion.     Cervical back: Normal range of motion.  Skin:    General: Skin is warm.  Neurological:     General: No focal deficit present.     Mental Status: She is alert and oriented to person, place, and time. Mental status is at baseline.  Psychiatric:        Mood and Affect: Mood normal.        Behavior: Behavior normal.        Thought Content: Thought content normal.        Judgment: Judgment normal.    Assessment/Plan: 1. Type 2 diabetes mellitus with hyperglycemia, without long-term current use of insulin (HCC) A1C 7.3 today Increase dose of Farxiga to 10 mg daily - POCT HgB A1C - dapagliflozin propanediol (FARXIGA) 10 MG TABS tablet; Take 1 tablet (10 mg total) by mouth daily before breakfast.  Dispense: 90 tablet; Refill:  1  2. Essential hypertension Increase losartan to 100 m Will closely monitor BP response, consider adding HCTZ - losartan (COZAAR) 100 MG tablet; Take 1 tablet (100 mg total) by mouth daily.  Dispense: 90 tablet; Refill: 3  3. Sleep disturbance Difficulty to arouse post sedation Eppworth score 12 out of 24 Reports of snoring and daytime sleepiness - PSG Sleep Study; Future  General Counseling: Chakira verbalizes understanding of the findings of todays visit and agrees with plan of treatment. I have discussed any further diagnostic evaluation that may be needed or ordered today. We also reviewed her medications today. she has been encouraged to call the office with any questions or concerns that should arise related to todays visit.    Orders Placed This Encounter  Procedures  . POCT HgB A1C  . PSG Sleep Study    Meds ordered this encounter  Medications  . dapagliflozin propanediol (FARXIGA) 10 MG TABS tablet    Sig: Take 1 tablet (10 mg total) by mouth daily before breakfast.    Dispense:  90 tablet    Refill:  1  . losartan (COZAAR) 100 MG tablet    Sig: Take 1 tablet (100 mg total) by mouth daily.    Dispense:  90 tablet    Refill:  3    Time spent: 30 Minutes Time spent includes review of chart, medications, test results and follow-up plan with the patient.  This patient was seen by Theodoro Grist AGNP-C in Collaboration with Dr Lavera Guise as a part of collaborative care agreement     Tanna Furry. Fusako Tanabe AGNP-C Internal medicine

## 2020-05-25 ENCOUNTER — Encounter: Payer: Self-pay | Admitting: Hospice and Palliative Medicine

## 2020-05-26 ENCOUNTER — Other Ambulatory Visit: Payer: Self-pay | Admitting: Hospice and Palliative Medicine

## 2020-05-26 DIAGNOSIS — F320 Major depressive disorder, single episode, mild: Secondary | ICD-10-CM

## 2020-05-27 ENCOUNTER — Telehealth: Payer: Self-pay

## 2020-05-27 NOTE — Telephone Encounter (Signed)
Gave [facility] orders for sleep study. [name]

## 2020-05-28 ENCOUNTER — Telehealth: Payer: Self-pay

## 2020-08-20 ENCOUNTER — Encounter: Payer: Self-pay | Admitting: Nurse Practitioner

## 2020-08-20 ENCOUNTER — Other Ambulatory Visit: Payer: Self-pay

## 2020-08-20 ENCOUNTER — Ambulatory Visit (INDEPENDENT_AMBULATORY_CARE_PROVIDER_SITE_OTHER): Payer: No Typology Code available for payment source | Admitting: Nurse Practitioner

## 2020-08-20 VITALS — BP 138/88 | HR 85 | Temp 98.1°F | Resp 16 | Ht 65.0 in | Wt 209.0 lb

## 2020-08-20 DIAGNOSIS — Z6834 Body mass index (BMI) 34.0-34.9, adult: Secondary | ICD-10-CM | POA: Diagnosis not present

## 2020-08-20 DIAGNOSIS — F320 Major depressive disorder, single episode, mild: Secondary | ICD-10-CM | POA: Diagnosis not present

## 2020-08-20 DIAGNOSIS — E1165 Type 2 diabetes mellitus with hyperglycemia: Secondary | ICD-10-CM | POA: Diagnosis not present

## 2020-08-20 DIAGNOSIS — I10 Essential (primary) hypertension: Secondary | ICD-10-CM

## 2020-08-20 LAB — POCT GLYCOSYLATED HEMOGLOBIN (HGB A1C): Hemoglobin A1C: 6.2 % — AB (ref 4.0–5.6)

## 2020-08-20 NOTE — Progress Notes (Signed)
[facility] [name], [place] [address]  Internal MEDICINE  Office Visit Note  Patient Name: [name]  [id number]  [id number]  Date of Service: 08/20/2020  Chief Complaint  Patient presents with  . Follow-up    Pt states she had COVID in march 3rd but has had no symptoms    . Diabetes  . Hypertension    HPI [name] presents for a follow up visit regarding diabetes and hypertension. She has a history of cholecystitis, atopic dermatitis and depression. Most recent labs were drawn in December 2021. Labs were normal except for elevated glucose 193.  A1C in February 2022 was 7.3. A1C today is 6.2 which is a 1.1 drop in 3 months.  She is taking farxiga, dose was increased at her previous visit. She ended up having a vaginal yeast infection, treated by GYN with fluconazole.  -diet - Clark is working on her diet, decreasing breads, high carb foods, and increasing lean protein. She is trying to stay active by walking everyday or at least 5 days a week.  -history of depression, which is improving per patient, taking escitalopram.     Current Medication: Outpatient Encounter Medications as of 08/20/2020  Medication Sig  . Accu-Chek Softclix Lancets lancets Use as instructed one a day e11.65  . dapagliflozin propanediol (FARXIGA) 10 MG TABS tablet Take 1 tablet (10 mg total) by mouth daily before breakfast.  . escitalopram (LEXAPRO) 5 MG tablet TAKE 2 TABLETS BY MOUTH EVERY NIGHT  . glucose blood (ACCU-CHEK GUIDE) test strip Use as instructed Once a day dx e11.65  . ibuprofen (ADVIL) 600 MG tablet Take 1 tablet (600 mg total) by mouth every 6 (six) hours.  Marland Kitchen losartan (COZAAR) 100 MG tablet Take 1 tablet (100 mg total) by mouth daily.  Marland Kitchen OVER THE COUNTER MEDICATION Take 1 capsule by mouth in the morning and at bedtime. Liver support  . phenazopyridine (PYRIDIUM) 200 MG tablet Take 1 tablet (200 mg total) by mouth 3 (three) times daily with meals.   No  facility-administered encounter medications on file as of 08/20/2020.    Surgical History: Past Surgical History:  Procedure Laterality Date  . BREAST BIOPSY Left 2012   benign  . CYSTOSCOPY W/ URETERAL STENT PLACEMENT Bilateral 03/31/2020   Procedure: CYSTOSCOPY WITH STENT REPLACEMENT;  Surgeon: Abbie Sons, MD;  Location: [facility] ORS;  Service: Urology;  Laterality: Bilateral;  . TOTAL LAPAROSCOPIC HYSTERECTOMY WITH SALPINGECTOMY Bilateral 03/31/2020   Procedure: TOTAL LAPAROSCOPIC HYSTERECTOMY WITH BILATERAL SALPINGECTOMY;  Surgeon: Ward, Honor Loh, MD;  Location: [facility] ORS;  Service: Gynecology;  Laterality: Bilateral;    Medical History: Past Medical History:  Diagnosis Date  . Chest pain 2011  . Diabetes mellitus, type 2 ([place])   . Fatty liver   . HTN (hypertension)   . Hypoglycemia   . Hypokalemia   . Plantar fasciitis of right foot   . PONV (postoperative nausea and vomiting)     Family History: Family History  Problem Relation Age of Onset  . Heart disease Mother   . Coronary artery disease Mother 17  . Heart failure Mother        chf  . Diabetes Mother   . Heart disease Father        Defibrillator  . Diabetes Father   . Diabetes Sister     Social History   Socioeconomic History  . Marital status: Married    Spouse name: Merry Proud  . Number of children: 3  .  Years of education: Not on file  . Highest education level: Not on file  Occupational History  . 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 Smoking status: Never Smoker  . Smokeless tobacco: Never Used  Vaping Use  . Vaping Use: Never used  Substance and Sexual Activity  . Alcohol use: Yes    Alcohol/week: 1.0 standard drink    Types: 1 Glasses of wine per week    Comment: few times a week   . Drug use: Never  . Sexual activity: Not on file  Other Topics Concern  . Not on file  Social History Narrative  . Not on file   Social  Determinants of Health   Financial Resource Strain: Not on file  Food Insecurity: Not on file  Transportation Needs: Not on file  Physical Activity: Not on file  Stress: Not on file  Social Connections: Not on file  Intimate Partner Violence: Not on file      Review of Systems  Constitutional: Negative.  Negative for chills, fatigue and unexpected weight change.  HENT: Negative for congestion, rhinorrhea, sneezing and sore throat.   Eyes: Negative for redness.  Respiratory: Negative.  Negative for cough, chest tightness and shortness of breath.   Cardiovascular: Negative.  Negative for chest pain and palpitations.  Gastrointestinal: Negative.  Negative for abdominal pain, constipation, diarrhea, nausea and vomiting.  Genitourinary: Negative.  Negative for dysuria and frequency.  Musculoskeletal: Negative for arthralgias, back pain, joint swelling and neck pain.  Skin: Negative for rash.  Neurological: Negative.  Negative for tremors and numbness.  Hematological: Negative for adenopathy. Does not bruise/bleed easily.  Psychiatric/Behavioral: Negative for behavioral problems (Depression), sleep disturbance and suicidal ideas. The patient is not nervous/anxious.     Vital Signs: BP 138/88   Pulse 85   Temp 98.1 F (36.7 C)   Resp 16   Ht 5\' 5"  (1.651 m)   Wt 209 lb (94.8 kg)   SpO2 93%   BMI 34.78 kg/m    Physical Exam Constitutional:      General: She is not in acute distress.    Appearance: She is well-developed. She is not diaphoretic.  HENT:     Head: Normocephalic and atraumatic.     Right Ear: External ear normal.     Left Ear: External ear normal.     Nose: Nose normal.     Mouth/Throat:     Pharynx: No oropharyngeal exudate.  Eyes:     General: No scleral icterus.       Right eye: No discharge.        Left eye: No discharge.     Conjunctiva/sclera: Conjunctivae normal.     Pupils: Pupils are equal, round, and reactive to light.  Neck:     Thyroid: No  thyromegaly.     Vascular: No JVD.     Trachea: No tracheal deviation.  Cardiovascular:     Rate and Rhythm: Normal rate and regular rhythm.     Heart sounds: Normal heart sounds. No murmur heard. No friction rub. No gallop.   Pulmonary:     Effort: Pulmonary effort is normal. No respiratory distress.     Breath sounds: Normal breath sounds. No stridor. No wheezing or rales.  Chest:     Chest wall: No tenderness.  Abdominal:     General: Bowel sounds are normal. There is no distension.     Palpations:  Abdomen is soft. There is no mass.     Tenderness: There is no abdominal tenderness. There is no guarding or rebound.  Musculoskeletal:        General: No tenderness or deformity. Normal range of motion.     Cervical back: Normal range of motion and neck supple.  Lymphadenopathy:     Cervical: No cervical adenopathy.  Skin:    General: Skin is warm and dry.     Coloration: Skin is not pale.     Findings: No erythema or rash.  Neurological:     Mental Status: She is alert and oriented to person, place, and time.     Motor: No abnormal muscle tone.     Deep Tendon Reflexes: Reflexes are normal and symmetric.  Psychiatric:        Behavior: Behavior normal.        Thought Content: Thought content normal.        Judgment: Judgment normal.    Assessment/Plan: 1. Type 2 diabetes mellitus with hyperglycemia, without long-term current use of insulin (HCC) Blood glucose levels well-controlled with [place]. A1c was 6.2 which was a 1.1 decrease from 3 months ago. Continue Farxiga, recheck A1C in 3 months. [name] wants to be able to stop taking [place] eventually. Will discuss at next visit after the POCT A1C results. - POCT HgB A1C  2. Essential hypertension Blood pressure well-controlled with losartan, no changes, continue medication as ordered.   3. BMI 34.0-34.9,adult Elevated BMI, weight 209 lbs. Discussed benefits of weight loss especially regarding her diabetes. Continue to decrease  breads, starches, and high carb foods from diet and add more lean protein. Discussed staying active, doing some sort of physical activity for 30 minutes 5 days a week. Recheck weight at follow up visit.   4. Depression, major, single episode, mild (HCC) Depressive symptoms well-controlled with escitalopram.    General Counseling: Sarahmarie verbalizes understanding of the findings of todays visit and agrees with plan of treatment. I have discussed any further diagnostic evaluation that may be needed or ordered today. We also reviewed her medications today. she has been encouraged to call the office with any questions or concerns that should arise related to todays visit.    Orders Placed This Encounter  Procedures  . POCT HgB A1C    No orders of the defined types were placed in this encounter.  Return in about 3 months (around 11/20/2020) for F/U, Recheck A1C, Obdulia Steier PCP.  Total time spent: 30 Minutes Time spent includes review of chart, medications, test results, and follow up plan with the patient.   [place] Controlled Substance Database was reviewed by me.  This patient was seen by Jonetta Osgood, FNP-C in collaboration with Dr. Clayborn Bigness as a part of collaborative care agreement.   Dr Lavera Guise Internal medicine

## 2020-08-27 ENCOUNTER — Other Ambulatory Visit: Payer: Self-pay

## 2020-08-27 DIAGNOSIS — F320 Major depressive disorder, single episode, mild: Secondary | ICD-10-CM

## 2020-08-27 MED ORDER — ESCITALOPRAM OXALATE 5 MG PO TABS: ORAL_TABLET | ORAL | 1 refills | Status: DC

## 2020-11-18 ENCOUNTER — Ambulatory Visit: Payer: No Typology Code available for payment source | Admitting: Nurse Practitioner

## 2020-11-27 ENCOUNTER — Ambulatory Visit: Payer: No Typology Code available for payment source | Admitting: Nurse Practitioner

## 2020-11-27 ENCOUNTER — Other Ambulatory Visit: Payer: Self-pay

## 2020-11-27 ENCOUNTER — Encounter: Payer: Self-pay | Admitting: Nurse Practitioner

## 2020-11-27 VITALS — BP 160/80 | HR 78 | Temp 97.8°F | Ht 65.0 in | Wt 211.0 lb

## 2020-11-27 DIAGNOSIS — I10 Essential (primary) hypertension: Secondary | ICD-10-CM

## 2020-11-27 DIAGNOSIS — E1165 Type 2 diabetes mellitus with hyperglycemia: Secondary | ICD-10-CM | POA: Diagnosis not present

## 2020-11-27 DIAGNOSIS — Z7689 Persons encountering health services in other specified circumstances: Secondary | ICD-10-CM | POA: Diagnosis not present

## 2020-11-27 DIAGNOSIS — L209 Atopic dermatitis, unspecified: Secondary | ICD-10-CM | POA: Diagnosis not present

## 2020-11-27 LAB — POCT GLYCOSYLATED HEMOGLOBIN (HGB A1C): Hemoglobin A1C: 6.3 % — AB (ref 4.0–5.6)

## 2020-11-27 MED ORDER — RYBELSUS 14 MG PO TABS: 14.0000 mg | ORAL_TABLET | Freq: Every day | ORAL | 0 refills | Status: DC

## 2020-11-27 NOTE — Patient Instructions (Signed)

## 2020-11-27 NOTE — Progress Notes (Signed)
New Patient Office Visit  Subjective:  Patient ID: [name], female    DOB: 1965/05/20  Age: 55 y.o. MRN: [id number]  CC:  Chief Complaint  Patient presents with   New Patient (Initial Visit)    HPI [name] presents to establish primary care in new primary care office.  She had complete hysterectomy 03/2020.  During surgery her bladder was "clipped."  Had to go home with a catheter for 10 days.  This gradually healed on its own.  She has had some urinary frequency since the surgery but this is gradually improving. [facility] in [place].  She states she had snored very loudly during surgery and it was recommended that she have a sleep study to evaluate her for obstructive sleep apnea. She had been started on Farxiga prior to her surgery.  She is taking this every day and her blood sugars have improved.  Her hemoglobin A1c is now 6.3.  She states since surgery she has been having candidal infection to the skin of the vaginal area.  Her GYN provider has started her on fluconazole 100 mg daily.  Since starting that medication she has had no problems with taking [place].  No other negative side effects have been reported.  She states if possible, she would like to try something different to control blood sugars. Blood pressure is a bit elevated today.  This is unusual for her since starting on blood pressure medication.  She denies chest pain, chest pressure, headache, or shortness of breath.  She states she has been working out routinely.  Has built up to 3 to 4 days/week and is feeling well with her workouts.  Past Medical History:  Diagnosis Date   Chest pain 2011   Diabetes mellitus, type 2 ([place])    Fatty liver    HTN (hypertension)    Hypoglycemia    Hypokalemia    Plantar fasciitis of right foot    PONV (postoperative nausea and vomiting)     Past Surgical History:  Procedure Laterality Date   BREAST BIOPSY Left 2012   benign    CYSTOSCOPY W/ URETERAL STENT PLACEMENT Bilateral 03/31/2020   Procedure: CYSTOSCOPY WITH STENT REPLACEMENT;  Surgeon: Abbie Sons, MD;  Location: [facility];  Service: Urology;  Laterality: Bilateral;   TOTAL LAPAROSCOPIC HYSTERECTOMY WITH SALPINGECTOMY Bilateral 03/31/2020   Procedure: TOTAL LAPAROSCOPIC HYSTERECTOMY WITH BILATERAL SALPINGECTOMY;  Surgeon: Ward, Honor Loh, MD;  Location: [facility];  Service: Gynecology;  Laterality: Bilateral;    Family History  Problem Relation Age of Onset   Heart disease Mother    Coronary artery disease Mother 37   Heart failure Mother        chf   Diabetes Mother    Heart disease Father        Defibrillator   Diabetes Father    Diabetes Sister     Social History   Socioeconomic History   Marital status: Married    Spouse name: Merry Proud   Number of children: 3   Years of education: Not on file   Highest education level: Not on file  Occupational History   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  Tobacco Use   Smoking status: Never   Smokeless tobacco: Never  Vaping Use   Vaping Use: Never used  Substance and Sexual Activity   Alcohol use: Yes    Alcohol/week: 1.0  standard drink    Types: 1 Glasses of wine per week    Comment: few times a week    Drug use: Never   Sexual activity: Not on file  Other Topics Concern   Not on file  Social History Narrative   Not on file   Social Determinants of Health   Financial Resource Strain: Not on file  Food Insecurity: Not on file  Transportation Needs: Not on file  Physical Activity: Not on file  Stress: Not on file  Social Connections: Not on file  Intimate Partner Violence: Not on file    ROS Review of Systems  Constitutional:  Negative for activity change, appetite change, chills, fatigue and fever.  HENT:  Negative for congestion, postnasal drip, rhinorrhea, sinus pressure, sinus pain, sneezing and sore throat.   Eyes: Negative.    Respiratory:  Negative for cough, chest tightness, shortness of breath and wheezing.   Cardiovascular:  Negative for chest pain and palpitations.       Blood pressure elevated today.  Gastrointestinal:  Negative for abdominal pain, constipation, diarrhea, nausea and vomiting.  Endocrine: Negative for cold intolerance, heat intolerance, polydipsia and polyuria.       Improved blood sugars.  Hemoglobin A1c 6.3 today.  Genitourinary:  Negative for dyspareunia, dysuria, flank pain, frequency and urgency.  Musculoskeletal:  Negative for arthralgias, back pain and myalgias.  Skin:  Negative for rash.  Allergic/Immunologic: Negative for environmental allergies.  Neurological:  Negative for dizziness, weakness and headaches.  Hematological:  Negative for adenopathy.  Psychiatric/Behavioral:  The patient is not nervous/anxious.    Objective:   Today's Vitals   11/27/20 1321 11/27/20 1408  BP: (!) 161/81 (!) 160/80  Pulse: 78   Temp: 97.8 F (36.6 C)   SpO2: 90%   Weight: 211 lb (95.7 kg)   Height: '5\' 5"'$  (1.651 m)    Body mass index is 35.11 kg/m.   Physical Exam Vitals and nursing note reviewed.  Constitutional:      Appearance: Normal appearance. She is well-developed. She is obese.  HENT:     Head: Normocephalic and atraumatic.     Mouth/Throat:     Mouth: Mucous membranes are moist.     Pharynx: Oropharynx is clear.  Eyes:     Extraocular Movements: Extraocular movements intact.     Conjunctiva/sclera: Conjunctivae normal.     Pupils: Pupils are equal, round, and reactive to light.  Cardiovascular:     Rate and Rhythm: Normal rate and regular rhythm.     Pulses: Normal pulses.     Heart sounds: Normal heart sounds.  Pulmonary:     Effort: Pulmonary effort is normal.     Breath sounds: Normal breath sounds.  Abdominal:     Palpations: Abdomen is soft.  Musculoskeletal:        General: Normal range of motion.     Cervical back: Normal range of motion and neck supple.   Lymphadenopathy:     Cervical: No cervical adenopathy.  Skin:    General: Skin is warm and dry.     Capillary Refill: Capillary refill takes less than 2 seconds.  Neurological:     General: No focal deficit present.     Mental Status: She is alert and oriented to person, place, and time.  Psychiatric:        Mood and Affect: Mood normal.        Behavior: Behavior normal.        Thought Content: Thought content  normal.        Judgment: Judgment normal.    Assessment & Plan:  1. Encounter to establish care Appointment today to establish new primary care provider.  2. Type 2 diabetes mellitus with hyperglycemia, without long-term current use of insulin (HCC) Hemoglobin A1c is 6.3 today.  Due to chronic cutaneous candidal infection, will DC [place].  Trial Rybelsus 14 mg tablets daily.  Recommend she continue with low carbohydrate low sugar diet and continue with routine exercise program.  We will recheck hemoglobin A1c in 3 months. - POCT glycosylated hemoglobin (Hb A1C) - Semaglutide (RYBELSUS) 14 MG TABS; Take 14 mg by mouth daily.  Dispense: 90 tablet; Refill: 0  3. Essential hypertension Blood pressure is elevated today.  Encouraged her to limit salt intake and increase water in her diet.  Advised her to monitor blood pressures at home.  The goal is to have blood sugars around 140/80.  4. Atopic dermatitis, unspecified type For now, we will have her continue taking fluconazole 100 mg tablets daily.  Will gradually wean her off this if tolerating Rybelsus well.  Problem List Items Addressed This Visit       Cardiovascular and Mediastinum   Essential hypertension     Endocrine   Type 2 diabetes mellitus with hyperglycemia, without long-term current use of insulin (HCC)   Relevant Medications   Semaglutide (RYBELSUS) 14 MG TABS   Other Relevant Orders   POCT glycosylated hemoglobin (Hb A1C) (Completed)     Musculoskeletal and Integument   Atopic dermatitis     Other    Encounter to establish care - Primary    Outpatient Encounter Medications as of 11/27/2020  Medication Sig   Accu-Chek Softclix Lancets lancets Use as instructed one a day e11.65   escitalopram (LEXAPRO) 5 MG tablet TAKE 2 TABLETS BY MOUTH EVERY NIGHT   glucose blood (ACCU-CHEK GUIDE) test strip Use as instructed Once a day dx e11.65   ibuprofen (ADVIL) 600 MG tablet Take 1 tablet (600 mg total) by mouth every 6 (six) hours.   losartan (COZAAR) 100 MG tablet Take 1 tablet (100 mg total) by mouth daily.   OVER THE COUNTER MEDICATION Take 1 capsule by mouth in the morning and at bedtime. Liver support   Semaglutide (RYBELSUS) 14 MG TABS Take 14 mg by mouth daily.   [DISCONTINUED] dapagliflozin propanediol (FARXIGA) 10 MG TABS tablet Take 1 tablet (10 mg total) by mouth daily before breakfast.   [DISCONTINUED] phenazopyridine (PYRIDIUM) 200 MG tablet Take 1 tablet (200 mg total) by mouth 3 (three) times daily with meals. (Patient not taking: Reported on 11/27/2020)   No facility-administered encounter medications on file as of 11/27/2020.   This note was dictated using Systems analyst. Rapid proofreading was performed to expedite the delivery of the information. Despite proofreading, phonetic errors will occur which are common with this voice recognition software. Please take this into consideration. If there are any concerns, please contact our office.    Follow-up: Return in about 3 months (around 02/27/2021) for DM, HTN.   Ronnell Freshwater, NP

## 2020-12-02 ENCOUNTER — Telehealth: Payer: Self-pay | Admitting: Nurse Practitioner

## 2020-12-02 NOTE — Telephone Encounter (Signed)
She should stop the rybelsus for next 24 to 48 hours and restart with 1/2 tablet daily and see if that makes a difference. She shoulld monitor her blood sugars everyday. They will likely increase over the time she is off medication, but should come back down after she restarts it again.

## 2020-12-02 NOTE — Telephone Encounter (Signed)
Patient changed to Rybelsus from [place] and patient is nauseas and pain in her stomach. Patient has thrown up like four times. Patient started taking it on Friday and started nauseous on Saturday. Please advise, thanks. Patient has not taken medication today.

## 2020-12-02 NOTE — Telephone Encounter (Signed)
Called pt she is aware to discontinued her medication for the next 24 to 48 hrs and restart back with a 1/2 tablet and to monitor her blood sugar

## 2020-12-08 DIAGNOSIS — Z7689 Persons encountering health services in other specified circumstances: Secondary | ICD-10-CM | POA: Insufficient documentation

## 2020-12-10 ENCOUNTER — Telehealth: Payer: Self-pay | Admitting: Nurse Practitioner

## 2020-12-10 ENCOUNTER — Other Ambulatory Visit: Payer: Self-pay | Admitting: Nurse Practitioner

## 2020-12-10 DIAGNOSIS — I10 Essential (primary) hypertension: Secondary | ICD-10-CM

## 2020-12-10 MED ORDER — HYDROCHLOROTHIAZIDE 12.5 MG PO TABS: 12.5000 mg | ORAL_TABLET | Freq: Every day | ORAL | 2 refills | Status: DC

## 2020-12-10 NOTE — Telephone Encounter (Signed)
Patients recent at home BP readings are below:  8/29 151/71 8/30 143/70 8/31 166/75 9/1 165/83 9/2 154/79 9/3 166/87 9/4 166/87 9/5 150/76    Patient BP is not controlled on Losartan '100mg'$ .   Patient states she is also only able to tolerate 1/2 tablet of Rybelsus (only taking '7mg'$ )   [facility]

## 2020-12-10 NOTE — Progress Notes (Signed)
Blood pressure not controlled with losartan '100mg'$  daily. Added hctz 12.'5mg'$  tablets daily. Cotiue to moniktor daily. Patient only able to tolerate '7mg'$  Rybelsus. I think this is fine. Will still provider her better glucose control without the negative side effects she was getting from [place].

## 2020-12-10 NOTE — Telephone Encounter (Signed)
Patient is aware of the results and verbalized understanding. AS, CMA 

## 2020-12-10 NOTE — Telephone Encounter (Signed)
please let her know that I have added hctz 12.'5mg'$  tablets daily.she should continue to monitor her BP at home daily. Patient only able to tolerate '7mg'$  Rybelsus. I think this is fine. Will still provider her better glucose control without the negative side effects she was getting from [place]. Thanks so much.   -HB

## 2021-01-08 ENCOUNTER — Encounter: Payer: No Typology Code available for payment source | Admitting: Nurse Practitioner

## 2021-01-15 ENCOUNTER — Encounter: Payer: No Typology Code available for payment source | Admitting: Nurse Practitioner

## 2021-01-27 ENCOUNTER — Telehealth: Payer: Self-pay

## 2021-01-27 NOTE — Telephone Encounter (Signed)
[facility] was unable to schedule patient due to finances. Fax received from [facility] stating patient canceled psg study.

## 2021-02-09 ENCOUNTER — Other Ambulatory Visit: Payer: Self-pay | Admitting: Nurse Practitioner

## 2021-02-09 DIAGNOSIS — I10 Essential (primary) hypertension: Secondary | ICD-10-CM

## 2021-03-03 ENCOUNTER — Encounter: Payer: Self-pay | Admitting: Nurse Practitioner

## 2021-03-03 ENCOUNTER — Other Ambulatory Visit: Payer: Self-pay

## 2021-03-03 ENCOUNTER — Ambulatory Visit: Payer: No Typology Code available for payment source | Admitting: Nurse Practitioner

## 2021-03-03 VITALS — BP 135/76 | HR 71 | Temp 97.5°F | Ht 65.0 in | Wt 218.7 lb

## 2021-03-03 DIAGNOSIS — E1165 Type 2 diabetes mellitus with hyperglycemia: Secondary | ICD-10-CM

## 2021-03-03 DIAGNOSIS — B372 Candidiasis of skin and nail: Secondary | ICD-10-CM | POA: Diagnosis not present

## 2021-03-03 DIAGNOSIS — I10 Essential (primary) hypertension: Secondary | ICD-10-CM | POA: Diagnosis not present

## 2021-03-03 LAB — POCT GLYCOSYLATED HEMOGLOBIN (HGB A1C): Hemoglobin A1C: 6.5 % — AB (ref 4.0–5.6)

## 2021-03-03 MED ORDER — TIRZEPATIDE 2.5 MG/0.5ML ~~LOC~~ SOAJ: 2.5000 mg | SUBCUTANEOUS | 0 refills | Status: DC

## 2021-03-03 MED ORDER — CLOTRIMAZOLE-BETAMETHASONE 1-0.05 % EX CREA: 1.0000 "application " | TOPICAL_CREAM | Freq: Two times a day (BID) | CUTANEOUS | 2 refills | Status: DC

## 2021-03-03 NOTE — Progress Notes (Signed)
Established Patient Office Visit  Subjective:  Patient ID: [name], female    DOB: 29-Sep-1965  Age: 55 y.o. MRN: [id number]  CC:  Chief Complaint  Patient presents with   Follow-up   Diabetes    HPI [name] presents for follow-up.  We had stopped [place] and did trial Rybelsus.  Patient currently taking 7 mg Rybelsus daily.  Hemoglobin A1c is 6.5.  Patient continuing to gain weight.  Has gained 7 pounds since most recent visit.  Continues to eat a low calorie, low-fat, low sugar diet.  She is participating in exercise 3 to 4 days/week.  Very frustrated with lack of progress.  Blood pressure is generally well managed.  She has no other concerns or complaints today.  She denies chest pain, chest pressure, or shortness of breath. She denies headaches or visual disturbances. She denies abdominal pain, nausea, vomiting, or changes in bowel or bladder habits.    Past Medical History:  Diagnosis Date   Chest pain 2011   Diabetes mellitus, type 2 ([place])    Fatty liver    HTN (hypertension)    Hypoglycemia    Hypokalemia    Plantar fasciitis of right foot    PONV (postoperative nausea and vomiting)     Past Surgical History:  Procedure Laterality Date   BREAST BIOPSY Left 2012   benign   CYSTOSCOPY W/ URETERAL STENT PLACEMENT Bilateral 03/31/2020   Procedure: CYSTOSCOPY WITH STENT REPLACEMENT;  Surgeon: Abbie Sons, MD;  Location: [facility];  Service: Urology;  Laterality: Bilateral;   TOTAL LAPAROSCOPIC HYSTERECTOMY WITH SALPINGECTOMY Bilateral 03/31/2020   Procedure: TOTAL LAPAROSCOPIC HYSTERECTOMY WITH BILATERAL SALPINGECTOMY;  Surgeon: Ward, Honor Loh, MD;  Location: [facility];  Service: Gynecology;  Laterality: Bilateral;    Family History  Problem Relation Age of Onset   Heart disease Mother    Coronary artery disease Mother 47   Heart failure Mother        chf   Diabetes Mother    Heart disease Father        Defibrillator   Diabetes Father    Diabetes Sister      Social History   Socioeconomic History   Marital status: Married    Spouse name: Merry Proud   Number of children: 3   Years of education: Not on file   Highest education level: Not on file  Occupational History   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  Tobacco Use   Smoking status: Never   Smokeless tobacco: Never  Vaping Use   Vaping Use: Never used  Substance and Sexual Activity   Alcohol use: Yes    Alcohol/week: 1.0 standard drink    Types: 1 Glasses of wine per week    Comment: few times a week    Drug use: Never   Sexual activity: Not on file  Other Topics Concern   Not on file  Social History Narrative   Not on file   Social Determinants of Health   Financial Resource Strain: Not on file  Food Insecurity: Not on file  Transportation Needs: Not on file  Physical Activity: Not on file  Stress: Not on file  Social Connections: Not on file  Intimate Partner Violence: Not on file    Outpatient Medications Prior to Visit  Medication Sig Dispense Refill   Accu-Chek Softclix Lancets lancets Use as instructed one a day e11.65 100 each 1  escitalopram (LEXAPRO) 5 MG tablet TAKE 2 TABLETS BY MOUTH EVERY NIGHT 180 tablet 1   glucose blood (ACCU-CHEK GUIDE) test strip Use as instructed Once a day dx e11.65 100 each 1   hydrochlorothiazide (HYDRODIURIL) 12.5 MG tablet TAKE 1 TABLET BY MOUTH DAILY 30 tablet 2   ibuprofen (ADVIL) 600 MG tablet Take 1 tablet (600 mg total) by mouth every 6 (six) hours. 45 tablet 0   losartan (COZAAR) 100 MG tablet Take 1 tablet (100 mg total) by mouth daily. 90 tablet 3   OVER THE COUNTER MEDICATION Take 1 capsule by mouth in the morning and at bedtime. Liver support     Semaglutide (RYBELSUS) 14 MG TABS Take 14 mg by mouth daily. 90 tablet 0   No facility-administered medications prior to visit.    No Known Allergies  ROS Review of Systems  Constitutional:  Positive for unexpected weight  change. Negative for activity change, appetite change, chills, fatigue and fever.       Seven pound weight gain since her last visit.   HENT:  Negative for congestion, postnasal drip, rhinorrhea, sinus pressure, sinus pain, sneezing and sore throat.   Eyes: Negative.   Respiratory:  Negative for cough, chest tightness, shortness of breath and wheezing.   Cardiovascular:  Negative for chest pain and palpitations.  Gastrointestinal:  Negative for abdominal pain, constipation, diarrhea, nausea and vomiting.  Endocrine: Negative for cold intolerance, heat intolerance, polydipsia and polyuria.       Blood sugars doing well    Genitourinary:  Negative for dyspareunia, dysuria, flank pain, frequency and urgency.  Musculoskeletal:  Negative for arthralgias, back pain and myalgias.  Skin:  Negative for rash.  Allergic/Immunologic: Negative for environmental allergies.  Neurological:  Negative for dizziness, weakness and headaches.  Hematological:  Negative for adenopathy.  Psychiatric/Behavioral:  Positive for dysphoric mood. The patient is nervous/anxious.      Objective:    Physical Exam Vitals and nursing note reviewed.  Constitutional:      Appearance: Normal appearance. She is well-developed. She is obese.  HENT:     Head: Normocephalic and atraumatic.     Nose: Nose normal.     Mouth/Throat:     Mouth: Mucous membranes are moist.  Eyes:     Extraocular Movements: Extraocular movements intact.     Conjunctiva/sclera: Conjunctivae normal.     Pupils: Pupils are equal, round, and reactive to light.  Cardiovascular:     Rate and Rhythm: Normal rate and regular rhythm.     Pulses: Normal pulses.     Heart sounds: Normal heart sounds.  Pulmonary:     Effort: Pulmonary effort is normal.     Breath sounds: Normal breath sounds.  Abdominal:     Palpations: Abdomen is soft.  Musculoskeletal:        General: Normal range of motion.     Cervical back: Normal range of motion and neck  supple.  Lymphadenopathy:     Cervical: No cervical adenopathy.  Skin:    General: Skin is warm and dry.     Capillary Refill: Capillary refill takes less than 2 seconds.  Neurological:     General: No focal deficit present.     Mental Status: She is alert and oriented to person, place, and time.  Psychiatric:        Mood and Affect: Mood normal.        Behavior: Behavior normal.        Thought Content: Thought content normal.  Judgment: Judgment normal.    Today's Vitals   03/03/21 1435 03/03/21 1446  BP: (!) 143/84 135/76  Pulse: 71   Temp: (!) 97.5 F (36.4 C)   SpO2: 97%   Weight: 218 lb 11.2 oz (99.2 kg)   Height: 5\' 5"  (1.651 m)    Body mass index is 36.39 kg/m.   Wt Readings from Last 3 Encounters:  03/03/21 218 lb 11.2 oz (99.2 kg)  11/27/20 211 lb (95.7 kg)  08/20/20 209 lb (94.8 kg)     Health Maintenance Due  Topic Date Due   COVID-19 Vaccine (1) Never done   Pneumococcal Vaccine 60-32 Years old (1 - PCV) Never done   FOOT EXAM  Never done   OPHTHALMOLOGY EXAM  Never done   HIV Screening  Never done   TETANUS/TDAP  Never done   INFLUENZA VACCINE  Never done    There are no preventive care reminders to display for this patient.  Lab Results  Component Value Date   TSH 1.640 10/30/2019   Lab Results  Component Value Date   WBC 12.1 (H) 04/01/2020   HGB 11.5 (L) 04/01/2020   HCT 35.8 (L) 04/01/2020   MCV 89.3 04/01/2020   PLT 303 04/01/2020   Lab Results  Component Value Date   NA 136 04/01/2020   K 4.0 04/01/2020   CO2 22 04/01/2020   GLUCOSE 193 (H) 04/01/2020   BUN 13 04/01/2020   CREATININE 0.78 04/01/2020   BILITOT 0.4 01/16/2020   ALKPHOS 124 (H) 01/16/2020   AST 61 (H) 01/16/2020   ALT 44 (H) 01/16/2020   PROT 6.3 01/16/2020   ALBUMIN 3.7 (L) 01/16/2020   CALCIUM 8.1 (L) 04/01/2020   ANIONGAP 8 04/01/2020   Lab Results  Component Value Date   CHOL 192 10/30/2019   Lab Results  Component Value Date   HDL 37 (L)  10/30/2019   Lab Results  Component Value Date   LDLCALC 135 (H) 10/30/2019   Lab Results  Component Value Date   TRIG 112 10/30/2019   No results found for: [facility] Lab Results  Component Value Date   HGBA1C 6.5 (A) 03/03/2021      Assessment & Plan:  1. Type 2 diabetes mellitus with hyperglycemia, without long-term current use of insulin (HCC) Hemoglobin A1c 6.5 today.  Patient tolerating well however continues to have weight gain.  Discontinue Rybelsus.  Trial Mounjaro 2.5 mg weekly.  Demonstrated proper use of medication pens.  Sample provided today.  New prescription sent to pharmacy.  Recommend to check blood sugars daily, prior to eating.  The goal is to have fasting sugars between 70 and 110. - POCT glycosylated hemoglobin (Hb A1C) - tirzepatide (MOUNJARO) 2.5 MG/0.5ML Pen; Inject 2.5 mg into the skin once a week.  Dispense: 2 mL; Refill: 0  2. Cutaneous candidiasis Patient continues to have intermittent itching of vaginal area.  Renew Lotrisone cream which may be applied externally twice daily when needed. - clotrimazole-betamethasone (LOTRISONE) cream; Apply 1 application topically 2 (two) times daily.  Dispense: 45 g; Refill: 2  3. Essential hypertension Blood pressure stable.  Continue medications as prescribed.  Problem List Items Addressed This Visit       Cardiovascular and Mediastinum   Essential hypertension     Endocrine   Type 2 diabetes mellitus with hyperglycemia, without long-term current use of insulin (HCC) - Primary   Relevant Medications   tirzepatide (MOUNJARO) 2.5 MG/0.5ML Pen   Other Relevant Orders   POCT  glycosylated hemoglobin (Hb A1C) (Completed)     Musculoskeletal and Integument   Cutaneous candidiasis   Relevant Medications   clotrimazole-betamethasone (LOTRISONE) cream    Meds ordered this encounter  Medications   clotrimazole-betamethasone (LOTRISONE) cream    Sig: Apply 1 application topically 2 (two) times daily.     Dispense:  45 g    Refill:  2    Order Specific Question:   Supervising Provider    Answer:   Beatrice Lecher D [[id number]]   tirzepatide (MOUNJARO) 2.5 MG/0.5ML Pen    Sig: Inject 2.5 mg into the skin once a week.    Dispense:  2 mL    Refill:  0    Order Specific Question:   Supervising Provider    Answer:   Beatrice Lecher D [[id number]]    Follow-up: Return in about 4 weeks (around 03/31/2021) for changed diabetic medication from rybelsus to mounjaro 2.5mg  weekly .    Ronnell Freshwater, NP  This note was dictated using Systems analyst. Rapid proofreading was performed to expedite the delivery of the information. Despite proofreading, phonetic errors will occur which are common with this voice recognition software. Please take this into consideration. If there are any concerns, please contact our office.

## 2021-03-03 NOTE — Progress Notes (Signed)
Medication Samples have been provided to the patient.  Drug name: [name]       Strength: 2.5mg         Qty: 1 box  LOT: [id number]  Exp.Date: 10/14/22  Dosing instructions: Inject 2.5mg  once weekly  The patient has been instructed regarding the correct time, dose, and frequency of taking this medication, including desired effects and most common side effects.   [name] 3:13 PM 03/03/2021

## 2021-03-08 DIAGNOSIS — B372 Candidiasis of skin and nail: Secondary | ICD-10-CM | POA: Insufficient documentation

## 2021-03-31 ENCOUNTER — Encounter: Payer: Self-pay | Admitting: Nurse Practitioner

## 2021-03-31 ENCOUNTER — Ambulatory Visit: Payer: No Typology Code available for payment source | Admitting: Nurse Practitioner

## 2021-03-31 ENCOUNTER — Other Ambulatory Visit: Payer: Self-pay

## 2021-03-31 VITALS — BP 148/83 | HR 81 | Temp 98.1°F | Ht 65.0 in | Wt 219.9 lb

## 2021-03-31 DIAGNOSIS — J0140 Acute pansinusitis, unspecified: Secondary | ICD-10-CM | POA: Diagnosis not present

## 2021-03-31 DIAGNOSIS — E1165 Type 2 diabetes mellitus with hyperglycemia: Secondary | ICD-10-CM

## 2021-03-31 DIAGNOSIS — B379 Candidiasis, unspecified: Secondary | ICD-10-CM | POA: Diagnosis not present

## 2021-03-31 DIAGNOSIS — T3695XA Adverse effect of unspecified systemic antibiotic, initial encounter: Secondary | ICD-10-CM

## 2021-03-31 DIAGNOSIS — I10 Essential (primary) hypertension: Secondary | ICD-10-CM

## 2021-03-31 DIAGNOSIS — N390 Urinary tract infection, site not specified: Secondary | ICD-10-CM

## 2021-03-31 LAB — POCT URINALYSIS DIP (CLINITEK)
Bilirubin, UA: NEGATIVE
Glucose, UA: NEGATIVE mg/dL
Ketones, POC UA: NEGATIVE mg/dL
Leukocytes, UA: NEGATIVE
Nitrite, UA: NEGATIVE
Spec Grav, UA: 1.02 (ref 1.010–1.025)
Urobilinogen, UA: 0.2 E.U./dL
pH, UA: 7 (ref 5.0–8.0)

## 2021-03-31 MED ORDER — FLUCONAZOLE 150 MG PO TABS: ORAL_TABLET | ORAL | 0 refills | Status: DC

## 2021-03-31 MED ORDER — TIRZEPATIDE 2.5 MG/0.5ML ~~LOC~~ SOAJ: 2.5000 mg | SUBCUTANEOUS | 3 refills | Status: DC

## 2021-03-31 MED ORDER — AMOXICILLIN-POT CLAVULANATE 875-125 MG PO TABS: 1.0000 | ORAL_TABLET | Freq: Two times a day (BID) | ORAL | 0 refills | Status: DC

## 2021-03-31 NOTE — Progress Notes (Signed)
Established Patient Office Visit  Subjective:  Patient ID: [name], female    DOB: May 26, 1965  Age: 55 y.o. MRN: [id number]  CC:  Chief Complaint  Patient presents with   Med Change Request    HPI [name] presents for follow up visit. Was recently changed from rybelsus to [facility] 2.5mg  weekly. States that she is doing well with the change. Has not noted any negative side effects associated with taking Mounjaro. Likes that medication is only once weekly.  Has been having cough, scratchy throat, and nasal congestion for past 4 days. States that she is very fatigued. Has had to take more frequent naps due to fatigue. She also states that she has noted a strong odor to her urine. Denies painful urination. Denies pelvic or abdominal pain.  Has no further concerns or complaints today.   Past Medical History:  Diagnosis Date   Chest pain 2011   Diabetes mellitus, type 2 ([place])    Fatty liver    HTN (hypertension)    Hypoglycemia    Hypokalemia    Plantar fasciitis of right foot    PONV (postoperative nausea and vomiting)     Past Surgical History:  Procedure Laterality Date   BREAST BIOPSY Left 2012   benign   CYSTOSCOPY W/ URETERAL STENT PLACEMENT Bilateral 03/31/2020   Procedure: CYSTOSCOPY WITH STENT REPLACEMENT;  Surgeon: Abbie Sons, MD;  Location: [facility] ORS;  Service: Urology;  Laterality: Bilateral;   TOTAL LAPAROSCOPIC HYSTERECTOMY WITH SALPINGECTOMY Bilateral 03/31/2020   Procedure: TOTAL LAPAROSCOPIC HYSTERECTOMY WITH BILATERAL SALPINGECTOMY;  Surgeon: Ward, Honor Loh, MD;  Location: [facility];  Service: Gynecology;  Laterality: Bilateral;    Family History  Problem Relation Age of Onset   Heart disease Mother    Coronary artery disease Mother 66   Heart failure Mother        chf   Diabetes Mother    Heart disease Father        Defibrillator   Diabetes Father    Diabetes Sister     Social History   Socioeconomic History   Marital status: Married     Spouse name: [name]   Number of children: 3   Years of education: Not on file   Highest education level: Not on file  Occupational History   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  Tobacco Use   Smoking status: Never   Smokeless tobacco: Never  Vaping Use   Vaping Use: Never used  Substance and Sexual Activity   Alcohol use: Yes    Alcohol/week: 1.0 standard drink    Types: 1 Glasses of wine per week    Comment: few times a week    Drug use: Never   Sexual activity: Not on file  Other Topics Concern   Not on file  Social History Narrative   Not on file   Social Determinants of Health   Financial Resource Strain: Not on file  Food Insecurity: Not on file  Transportation Needs: Not on file  Physical Activity: Not on file  Stress: Not on file  Social Connections: Not on file  Intimate Partner Violence: Not on file    Outpatient Medications Prior to Visit  Medication Sig Dispense Refill   Accu-Chek Softclix Lancets lancets Use as instructed one a day e11.65 100 each 1   clotrimazole-betamethasone (LOTRISONE) cream Apply 1 application topically 2 (two) times daily. 45 g 2  escitalopram (LEXAPRO) 5 MG tablet TAKE 2 TABLETS BY MOUTH EVERY NIGHT 180 tablet 1   glucose blood (ACCU-CHEK GUIDE) test strip Use as instructed Once a day dx e11.65 100 each 1   hydrochlorothiazide (HYDRODIURIL) 12.5 MG tablet TAKE 1 TABLET BY MOUTH DAILY 30 tablet 2   losartan (COZAAR) 100 MG tablet Take 1 tablet (100 mg total) by mouth daily. 90 tablet 3   tirzepatide (MOUNJARO) 2.5 MG/0.5ML Pen Inject 2.5 mg into the skin once a week. 2 mL 0   OVER THE COUNTER MEDICATION Take 1 capsule by mouth in the morning and at bedtime. Liver support (Patient not taking: Reported on 03/31/2021)     ibuprofen (ADVIL) 600 MG tablet Take 1 tablet (600 mg total) by mouth every 6 (six) hours. 45 tablet 0   No facility-administered medications prior to visit.    No  Known Allergies  ROS Review of Systems  Constitutional:  Positive for fatigue. Negative for activity change, appetite change, chills and fever.  HENT:  Positive for congestion, postnasal drip, rhinorrhea and sore throat. Negative for sinus pressure, sinus pain and sneezing.   Eyes: Negative.   Respiratory:  Positive for cough. Negative for chest tightness, shortness of breath and wheezing.   Cardiovascular:  Negative for chest pain and palpitations.       Blood pressure mildly elevated today   Gastrointestinal:  Negative for abdominal pain, constipation, diarrhea, nausea and vomiting.  Endocrine: Negative for cold intolerance, heat intolerance, polydipsia and polyuria.  Genitourinary:  Negative for dyspareunia, dysuria, flank pain, frequency and urgency.       Has noted a strong odor to her urine the past few days   Musculoskeletal:  Negative for arthralgias, back pain and myalgias.  Skin:  Negative for rash.  Allergic/Immunologic: Negative for environmental allergies.  Neurological:  Negative for dizziness, weakness and headaches.  Hematological:  Negative for adenopathy.  Psychiatric/Behavioral:  The patient is not nervous/anxious.      Objective:    Physical Exam Vitals and nursing note reviewed.  Constitutional:      Appearance: Normal appearance. She is well-developed. She is obese.  HENT:     Head: Normocephalic and atraumatic.     Right Ear: Tympanic membrane is bulging.     Left Ear: Tympanic membrane is bulging.     Nose: Congestion present.     Right Sinus: Maxillary sinus tenderness and frontal sinus tenderness present.     Left Sinus: Maxillary sinus tenderness and frontal sinus tenderness present.     Mouth/Throat:     Mouth: Mucous membranes are moist.     Pharynx: Posterior oropharyngeal erythema present.  Eyes:     Extraocular Movements: Extraocular movements intact.     Conjunctiva/sclera: Conjunctivae normal.     Pupils: Pupils are equal, round, and reactive  to light.  Cardiovascular:     Rate and Rhythm: Normal rate and regular rhythm.     Pulses: Normal pulses.     Heart sounds: Normal heart sounds.  Pulmonary:     Effort: Pulmonary effort is normal.     Breath sounds: Normal breath sounds.  Abdominal:     Palpations: Abdomen is soft.  Genitourinary:    Comments: Urine showing a trace of blood and trace protein.  Musculoskeletal:        General: Normal range of motion.     Cervical back: Normal range of motion and neck supple.  Lymphadenopathy:     Cervical: No cervical adenopathy.  Skin:  General: Skin is warm and dry.     Capillary Refill: Capillary refill takes less than 2 seconds.  Neurological:     General: No focal deficit present.     Mental Status: She is alert and oriented to person, place, and time.  Psychiatric:        Mood and Affect: Mood normal.        Behavior: Behavior normal.        Thought Content: Thought content normal.        Judgment: Judgment normal.    Today's Vitals   03/31/21 1342 03/31/21 1408  BP: (!) 147/79 (!) 148/83  Pulse: 81   Temp: 98.1 F (36.7 C)   SpO2: 96%   Weight: 219 lb 14.4 oz (99.7 kg)   Height: 5\' 5"  (1.651 m)    Body mass index is 36.59 kg/m.   Wt Readings from Last 3 Encounters:  03/31/21 219 lb 14.4 oz (99.7 kg)  03/03/21 218 lb 11.2 oz (99.2 kg)  11/27/20 211 lb (95.7 kg)     Health Maintenance Due  Topic Date Due   FOOT EXAM  Never done   OPHTHALMOLOGY EXAM  Never done   HIV Screening  Never done    There are no preventive care reminders to display for this patient.  Lab Results  Component Value Date   TSH 1.640 10/30/2019   Lab Results  Component Value Date   WBC 12.1 (H) 04/01/2020   HGB 11.5 (L) 04/01/2020   HCT 35.8 (L) 04/01/2020   MCV 89.3 04/01/2020   PLT 303 04/01/2020   Lab Results  Component Value Date   NA 136 04/01/2020   K 4.0 04/01/2020   CO2 22 04/01/2020   GLUCOSE 193 (H) 04/01/2020   BUN 13 04/01/2020   CREATININE 0.78  04/01/2020   BILITOT 0.4 01/16/2020   ALKPHOS 124 (H) 01/16/2020   AST 61 (H) 01/16/2020   ALT 44 (H) 01/16/2020   PROT 6.3 01/16/2020   ALBUMIN 3.7 (L) 01/16/2020   CALCIUM 8.1 (L) 04/01/2020   ANIONGAP 8 04/01/2020   Lab Results  Component Value Date   CHOL 192 10/30/2019   Lab Results  Component Value Date   HDL 37 (L) 10/30/2019   Lab Results  Component Value Date   LDLCALC 135 (H) 10/30/2019   Lab Results  Component Value Date   TRIG 112 10/30/2019   No results found for: [facility] Lab Results  Component Value Date   HGBA1C 6.5 (A) 03/03/2021      Assessment & Plan:  1. Acute non-recurrent pansinusitis Start augmentin 875mg  twice daily for next 7 days. Rest and increase fluids. Continue using OTC medication to control symptoms.    2. Antibiotic-induced yeast infection May take diflucan if yeast infection develops. May repeat in three days for persistent symptoms.  - fluconazole (DIFLUCAN) 150 MG tablet; Take 1 tablet po once. May repeat dose in 3 days as needed for persistent symptoms.  Dispense: 3 tablet; Refill: 0  3. Type 2 diabetes mellitus with hyperglycemia, without long-term current use of insulin (HCC) Well managed. Continue Mounjaro 2.5mg  weekly.  Limit intake of carbohydrates and sugar and increase water intake. Monitor blood sugars daily, prior to eating. Goal is to have blood sugar between 70 and 120.  - tirzepatide (MOUNJARO) 2.5 MG/0.5ML Pen; Inject 2.5 mg into the skin once a week.  Dispense: 2 mL; Refill: 3  4. Urinary tract infection without hematuria, site unspecified Urine sample positive for trace protein  and trace blood. Treat with augmentin twice daily. Adjust antibiotic treatment as indicated.  - POCT URINALYSIS DIP (CLINITEK)  5. Essential hypertension Limit intake of sodium and increase water. Continue medication as prescribed. Goal is to have blood pressure at 130/80.    Problem List Items Addressed This Visit       Cardiovascular  and Mediastinum   Essential hypertension     Respiratory   Acute non-recurrent pansinusitis - Primary   Relevant Medications   fluconazole (DIFLUCAN) 150 MG tablet     Endocrine   Type 2 diabetes mellitus with hyperglycemia, without long-term current use of insulin (HCC)   Relevant Medications   tirzepatide (MOUNJARO) 2.5 MG/0.5ML Pen     Genitourinary   Urinary tract infection without hematuria   Relevant Medications   fluconazole (DIFLUCAN) 150 MG tablet   Other Relevant Orders   POCT URINALYSIS DIP (CLINITEK) (Completed)     Other   Antibiotic-induced yeast infection   Relevant Medications   fluconazole (DIFLUCAN) 150 MG tablet    Meds ordered this encounter  Medications   tirzepatide (MOUNJARO) 2.5 MG/0.5ML Pen    Sig: Inject 2.5 mg into the skin once a week.    Dispense:  2 mL    Refill:  3    Patient has tried and had negative side effects to invokana, farxiga, and rybelsus. Has done well with sample of mounjaro for past 4 weeks    Order Specific Question:   Supervising Provider    Answer:   Hali Marry [[id number]]   DISCONTD: amoxicillin-clavulanate (AUGMENTIN) 875-125 MG tablet    Sig: Take 1 tablet by mouth 2 (two) times daily.    Dispense:  14 tablet    Refill:  0    Order Specific Question:   Supervising Provider    Answer:   Beatrice Lecher D [[id number]]   fluconazole (DIFLUCAN) 150 MG tablet    Sig: Take 1 tablet po once. May repeat dose in 3 days as needed for persistent symptoms.    Dispense:  3 tablet    Refill:  0    Order Specific Question:   Supervising Provider    Answer:   Beatrice Lecher D [[id number]]    Follow-up: Return in about 3 months (around 06/29/2021) for diabetes with HgbA1c check, blood pressure.    Ronnell Freshwater, NP

## 2021-04-01 ENCOUNTER — Telehealth: Payer: Self-pay | Admitting: Nurse Practitioner

## 2021-04-01 ENCOUNTER — Other Ambulatory Visit: Payer: Self-pay | Admitting: Nurse Practitioner

## 2021-04-01 DIAGNOSIS — J0140 Acute pansinusitis, unspecified: Secondary | ICD-10-CM

## 2021-04-01 MED ORDER — AZITHROMYCIN 250 MG PO TABS: ORAL_TABLET | ORAL | 0 refills | Status: DC

## 2021-04-01 NOTE — Telephone Encounter (Signed)
Please let her know I changed the antibiotic to z-pack. Take as directed for 5 days. [facility] to [facility].  Thanks so much.   -HB

## 2021-04-01 NOTE — Telephone Encounter (Signed)
Patient calling the office stating she is vomiting and having diarrhea due to the Augmentin and is requesting you change the antibiotic. AS, CMA  [facility] [place]

## 2021-04-01 NOTE — Telephone Encounter (Signed)
Patient is aware and verbalized understanding. AS, CMA

## 2021-04-06 DIAGNOSIS — J0140 Acute pansinusitis, unspecified: Secondary | ICD-10-CM | POA: Insufficient documentation

## 2021-04-06 DIAGNOSIS — T3695XA Adverse effect of unspecified systemic antibiotic, initial encounter: Secondary | ICD-10-CM | POA: Insufficient documentation

## 2021-04-06 DIAGNOSIS — B379 Candidiasis, unspecified: Secondary | ICD-10-CM | POA: Insufficient documentation

## 2021-04-15 ENCOUNTER — Telehealth: Payer: Self-pay | Admitting: Nurse Practitioner

## 2021-04-15 ENCOUNTER — Other Ambulatory Visit: Payer: Self-pay | Admitting: Nurse Practitioner

## 2021-04-15 DIAGNOSIS — E1165 Type 2 diabetes mellitus with hyperglycemia: Secondary | ICD-10-CM

## 2021-04-15 MED ORDER — TIRZEPATIDE 5 MG/0.5ML ~~LOC~~ SOAJ: 5.0000 mg | SUBCUTANEOUS | 0 refills | Status: DC

## 2021-04-15 NOTE — Telephone Encounter (Signed)
Patient states she was recently started on Monjaro dose 2.5mg . Her fasting blood sugars at home are consistently between 160-170. She is requesting to increase her dose to 5mg . AS, CMA

## 2021-04-15 NOTE — Telephone Encounter (Signed)
Patient is aware of the below and verbalized understanding. AS, CMA 

## 2021-04-15 NOTE — Telephone Encounter (Signed)
That's fine. I changed the dosing of her mounjaro to 5mg  weekly and sent a new prescription to [facility].

## 2021-05-15 ENCOUNTER — Other Ambulatory Visit: Payer: Self-pay | Admitting: Internal Medicine

## 2021-05-15 DIAGNOSIS — F320 Major depressive disorder, single episode, mild: Secondary | ICD-10-CM

## 2021-06-02 ENCOUNTER — Telehealth: Payer: Self-pay | Admitting: Nurse Practitioner

## 2021-06-02 DIAGNOSIS — I10 Essential (primary) hypertension: Secondary | ICD-10-CM

## 2021-06-02 MED ORDER — LOSARTAN POTASSIUM 100 MG PO TABS: 100.0000 mg | ORAL_TABLET | Freq: Every day | ORAL | 0 refills | Status: DC

## 2021-06-02 NOTE — Telephone Encounter (Signed)
Patient requesting refill of Losartan sent to [facility] in [place]. Please advise. [phone]

## 2021-06-02 NOTE — Telephone Encounter (Signed)
Refill sent to pharmacy. AS, CMA 

## 2021-06-05 ENCOUNTER — Other Ambulatory Visit: Payer: Self-pay

## 2021-06-05 DIAGNOSIS — F320 Major depressive disorder, single episode, mild: Secondary | ICD-10-CM

## 2021-06-05 MED ORDER — ESCITALOPRAM OXALATE 5 MG PO TABS: ORAL_TABLET | ORAL | 1 refills | Status: DC

## 2021-06-12 ENCOUNTER — Other Ambulatory Visit: Payer: Self-pay | Admitting: Nurse Practitioner

## 2021-06-12 DIAGNOSIS — E1165 Type 2 diabetes mellitus with hyperglycemia: Secondary | ICD-10-CM

## 2021-06-29 ENCOUNTER — Ambulatory Visit (INDEPENDENT_AMBULATORY_CARE_PROVIDER_SITE_OTHER): Payer: No Typology Code available for payment source | Admitting: Nurse Practitioner

## 2021-06-29 ENCOUNTER — Other Ambulatory Visit: Payer: Self-pay

## 2021-06-29 ENCOUNTER — Encounter: Payer: Self-pay | Admitting: Nurse Practitioner

## 2021-06-29 VITALS — BP 133/73 | HR 77 | Temp 98.1°F | Ht 65.0 in | Wt 215.9 lb

## 2021-06-29 DIAGNOSIS — I10 Essential (primary) hypertension: Secondary | ICD-10-CM | POA: Diagnosis not present

## 2021-06-29 DIAGNOSIS — F320 Major depressive disorder, single episode, mild: Secondary | ICD-10-CM

## 2021-06-29 DIAGNOSIS — B379 Candidiasis, unspecified: Secondary | ICD-10-CM

## 2021-06-29 DIAGNOSIS — E1165 Type 2 diabetes mellitus with hyperglycemia: Secondary | ICD-10-CM

## 2021-06-29 DIAGNOSIS — T3695XA Adverse effect of unspecified systemic antibiotic, initial encounter: Secondary | ICD-10-CM

## 2021-06-29 LAB — POCT GLYCOSYLATED HEMOGLOBIN (HGB A1C): Hemoglobin A1C: 5.9 % — AB (ref 4.0–5.6)

## 2021-06-29 MED ORDER — ESCITALOPRAM OXALATE 10 MG PO TABS: ORAL_TABLET | ORAL | 2 refills | Status: DC

## 2021-06-29 MED ORDER — FLUCONAZOLE 150 MG PO TABS: ORAL_TABLET | ORAL | 0 refills | Status: DC

## 2021-06-29 MED ORDER — TIRZEPATIDE 7.5 MG/0.5ML ~~LOC~~ SOAJ: 7.5000 mg | SUBCUTANEOUS | 2 refills | Status: DC

## 2021-06-29 NOTE — Progress Notes (Signed)
Established patient visit ? ? ?Patient: [name]   DOB: 1966/01/07   56 y.o. Female  MRN: [id number] ?Visit Date: 06/29/2021 ? ? ?Chief Complaint  ?Patient presents with  ? Follow-up  ? Diabetes  ? ?Subjective  ?  ?HPI  ?The patient is here for routine follow up visit.  ?-started on monjaro at '5mg'$  weekly. Her HgbA1c has come down from 6.5 to 5.9 today. She has lost 4 pounds since her last visit.  ?-blood pressure doing well.  ?-has increased her dose of lexapro to '15mg'$  daily. Doing very well with this.  ?-has no new concerns or complaints today.  ? ? ?Medications: ?Outpatient Medications Prior to Visit  ?Medication Sig  ? Accu-Chek Softclix Lancets lancets Use as instructed one a day e11.65  ? clotrimazole-betamethasone (LOTRISONE) cream Apply 1 application topically 2 (two) times daily.  ? glucose blood (ACCU-CHEK GUIDE) test strip Use as instructed Once a day dx e11.65  ? hydrochlorothiazide (HYDRODIURIL) 12.5 MG tablet TAKE 1 TABLET BY MOUTH DAILY  ? losartan (COZAAR) 100 MG tablet Take 1 tablet (100 mg total) by mouth daily.  ? [DISCONTINUED] dapagliflozin propanediol (FARXIGA) 5 MG TABS tablet Take by mouth.  ? [DISCONTINUED] escitalopram (LEXAPRO) 5 MG tablet TAKE 2 TABLETS BY MOUTH EVERY NIGHT  ? [DISCONTINUED] escitalopram (LEXAPRO) 5 MG tablet daily.  ? [DISCONTINUED] fluconazole (DIFLUCAN) 150 MG tablet Take 1 tablet po once. May repeat dose in 3 days as needed for persistent symptoms.  ? [DISCONTINUED] losartan (COZAAR) 50 MG tablet Take by mouth.  ? [DISCONTINUED] MOUNJARO 5 MG/0.5ML Pen INJECT 5 MG INTO THE SKIN ONCE A WEEK.  ? ascorbic acid (VITAMIN C) 500 MG tablet Take by mouth.  ? cyanocobalamin 1000 MCG tablet Take by mouth.  ? [DISCONTINUED] azithromycin (ZITHROMAX) 250 MG tablet z-pack - take as directed for 5 days  ? [DISCONTINUED] OVER THE COUNTER MEDICATION Take 1 capsule by mouth in the morning and at bedtime. Liver support (Patient not taking: Reported on 03/31/2021)  ? ?No  facility-administered medications prior to visit.  ? ? ?Review of Systems  ?Constitutional:  Negative for activity change, appetite change, chills, fatigue and fever.  ?     Four pound weight loss since last visit   ?HENT:  Negative for congestion, postnasal drip, rhinorrhea, sinus pressure, sinus pain, sneezing and sore throat.   ?Eyes: Negative.   ?Respiratory:  Negative for cough, chest tightness, shortness of breath and wheezing.   ?Cardiovascular:  Negative for chest pain and palpitations.  ?Gastrointestinal:  Negative for abdominal pain, constipation, diarrhea, nausea and vomiting.  ?Endocrine: Negative for cold intolerance, heat intolerance, polydipsia and polyuria.  ?     Improved blood sugars   ?Genitourinary:  Negative for dyspareunia, dysuria, flank pain, frequency and urgency.  ?Musculoskeletal:  Negative for arthralgias, back pain and myalgias.  ?Skin:  Negative for rash.  ?Allergic/Immunologic: Negative for environmental allergies.  ?Neurological:  Negative for dizziness, weakness and headaches.  ?Hematological:  Negative for adenopathy.  ?Psychiatric/Behavioral:  Positive for dysphoric mood. The patient is nervous/anxious.   ? ? ? ? Objective  ?  ? ?Today's Vitals  ? 06/29/21 1333  ?BP: 133/73  ?Pulse: 77  ?Temp: 98.1 ?F (36.7 ?C)  ?SpO2: 97%  ?Weight: 215 lb 14.4 oz (97.9 kg)  ?Height: '5\' 5"'$  (1.651 m)  ? ?Body mass index is 35.93 kg/m?.  ? ?BP Readings from Last 3 Encounters:  ?06/29/21 133/73  ?03/31/21 (!) 148/83  ?03/03/21 135/76  ?  ?Wt Readings  from Last 3 Encounters:  ?06/29/21 215 lb 14.4 oz (97.9 kg)  ?03/31/21 219 lb 14.4 oz (99.7 kg)  ?03/03/21 218 lb 11.2 oz (99.2 kg)  ?  ?Physical Exam ?Vitals and nursing note reviewed.  ?Constitutional:   ?   Appearance: Normal appearance. She is well-developed.  ?HENT:  ?   Head: Normocephalic and atraumatic.  ?Eyes:  ?   Pupils: Pupils are equal, round, and reactive to light.  ?Cardiovascular:  ?   Rate and Rhythm: Normal rate and regular rhythm.  ?    Pulses: Normal pulses.  ?   Heart sounds: Normal heart sounds.  ?Pulmonary:  ?   Effort: Pulmonary effort is normal.  ?   Breath sounds: Normal breath sounds.  ?Abdominal:  ?   Palpations: Abdomen is soft.  ?Musculoskeletal:     ?   General: Normal range of motion.  ?   Cervical back: Normal range of motion and neck supple.  ?Lymphadenopathy:  ?   Cervical: No cervical adenopathy.  ?Skin: ?   General: Skin is warm and dry.  ?   Capillary Refill: Capillary refill takes less than 2 seconds.  ?Neurological:  ?   General: No focal deficit present.  ?   Mental Status: She is alert and oriented to person, place, and time.  ?Psychiatric:     ?   Mood and Affect: Mood normal.     ?   Behavior: Behavior normal.     ?   Thought Content: Thought content normal.     ?   Judgment: Judgment normal.  ?  ? ? Assessment & Plan  ?  ?1. Type 2 diabetes mellitus with hyperglycemia, without long-term current use of insulin ([place]) ?HgbA1c is 5.9 today, down from 6.5 at last check. No negative side effects from [facility]. Increase to 7.'5mg'$  weekly. Recommend she check blood sugars daily, fasting. The goal is to have that fasting glucose between 70 and 100. Recheck HgbA1c in three months.  ?- POCT glycosylated hemoglobin (Hb A1C) ?- tirzepatide (MOUNJARO) 7.5 MG/0.5ML Pen; Inject 7.5 mg into the skin once a week.  Dispense: 6 mL; Refill: 2 ? ?2. Essential hypertension ?Stable. Continue b medication as prescribed ? ?3. Depression, major, single episode, mild ([place]) ?Take lexapro '15mg'$  daily. Updated prescription to reflect increased dosing.  ?- escitalopram (LEXAPRO) 10 MG tablet; Take 1.5 tablets po QD  Dispense: 45 tablet; Refill: 2 ? ?4. Antibiotic-induced yeast infection ?Ay take diflucan as needed and as prescirbed  ?- fluconazole (DIFLUCAN) 150 MG tablet; Take 1 tablet po once. May repeat dose in 3 days as needed for persistent symptoms.  Dispense: 3 tablet; Refill: 0  ? ?Problem List Items Addressed This Visit   ? ?  ? Cardiovascular and  Mediastinum  ? Essential hypertension  ?  ? Endocrine  ? Type 2 diabetes mellitus with hyperglycemia, without long-term current use of insulin (HCC) - Primary  ? Relevant Medications  ? tirzepatide (MOUNJARO) 7.5 MG/0.5ML Pen  ? Other Relevant Orders  ? POCT glycosylated hemoglobin (Hb A1C) (Completed)  ?  ? Other  ? Depression, major, single episode, mild ([place])  ? Relevant Medications  ? escitalopram (LEXAPRO) 10 MG tablet  ? Antibiotic-induced yeast infection  ? Relevant Medications  ? fluconazole (DIFLUCAN) 150 MG tablet  ?  ? ?Return in about 3 months (around 09/29/2021) for diabetes with HgbA1c check, blood pressure.  ?   ? ? ? ? ?Ronnell Freshwater, NP  ?[facility] at  [facility] ?[phone] (phone) ?[phone] (fax) ? ?[facility]  ?

## 2021-07-08 ENCOUNTER — Telehealth: Payer: Self-pay | Admitting: Nurse Practitioner

## 2021-07-08 ENCOUNTER — Other Ambulatory Visit: Payer: Self-pay | Admitting: Nurse Practitioner

## 2021-07-08 DIAGNOSIS — I10 Essential (primary) hypertension: Secondary | ICD-10-CM

## 2021-07-08 MED ORDER — HYDROCHLOROTHIAZIDE 12.5 MG PO TABS: 12.5000 mg | ORAL_TABLET | Freq: Every day | ORAL | 3 refills | Status: DC

## 2021-07-08 NOTE — Telephone Encounter (Signed)
Patient requesting a refill of her HCTZ. Please advise. [phone] ?

## 2021-07-08 NOTE — Telephone Encounter (Signed)
Patient is aware 

## 2021-07-08 NOTE — Telephone Encounter (Signed)
I [facility] for [facility] ?

## 2021-08-21 ENCOUNTER — Other Ambulatory Visit: Payer: Self-pay | Admitting: Nurse Practitioner

## 2021-08-21 DIAGNOSIS — I10 Essential (primary) hypertension: Secondary | ICD-10-CM

## 2021-08-24 ENCOUNTER — Telehealth: Payer: Self-pay | Admitting: Nurse Practitioner

## 2021-08-24 ENCOUNTER — Other Ambulatory Visit: Payer: Self-pay | Admitting: Nurse Practitioner

## 2021-08-24 DIAGNOSIS — E1165 Type 2 diabetes mellitus with hyperglycemia: Secondary | ICD-10-CM

## 2021-08-24 MED ORDER — TIRZEPATIDE 10 MG/0.5ML ~~LOC~~ SOAJ: 10.0000 mg | SUBCUTANEOUS | 1 refills | Status: DC

## 2021-08-24 NOTE — Telephone Encounter (Signed)
Patient is requesting a refill of the [facility] but asking for the next dosage up. Please advise.

## 2021-08-24 NOTE — Progress Notes (Signed)
Increaed dose mounjaro to '10mg'$  and sent new prescription to [facility].

## 2021-08-24 NOTE — Telephone Encounter (Signed)
Increaed dose mounjaro to '10mg'$  and sent new prescription to [facility].

## 2021-08-25 NOTE — Telephone Encounter (Signed)
Called pt she states that it's on back order until June she have enough to get her through until that time

## 2021-09-10 ENCOUNTER — Other Ambulatory Visit: Payer: Self-pay | Admitting: Nurse Practitioner

## 2021-09-10 DIAGNOSIS — F320 Major depressive disorder, single episode, mild: Secondary | ICD-10-CM

## 2021-10-21 NOTE — Progress Notes (Deleted)
Established patient visit   Patient: [name]   DOB: Sep 12, 1965   56 y.o. Female  MRN: [id number] Visit Date: 10/22/2021   No chief complaint on file.  Subjective    HPI Follow up visit  -type 2 diabetes -mounjaro increased to 10 mg weekly  --check HgbA1c today  --needs referral for diabetic eye exam  -blood pressure -due for mammogram  -due for routine fasting labs     Medications: Outpatient Medications Prior to Visit  Medication Sig   Accu-Chek Softclix Lancets lancets Use as instructed one a day e11.65   ascorbic acid (VITAMIN C) 500 MG tablet Take by mouth.   clotrimazole-betamethasone (LOTRISONE) cream Apply 1 application topically 2 (two) times daily.   cyanocobalamin 1000 MCG tablet Take by mouth.   escitalopram (LEXAPRO) 10 MG tablet TAKE 1 1/2 TABLETS EVERY DAY   fluconazole (DIFLUCAN) 150 MG tablet Take 1 tablet po once. May repeat dose in 3 days as needed for persistent symptoms.   glucose blood (ACCU-CHEK GUIDE) test strip Use as instructed Once a day dx e11.65   hydrochlorothiazide (HYDRODIURIL) 12.5 MG tablet Take 1 tablet (12.5 mg total) by mouth daily.   losartan (COZAAR) 100 MG tablet TAKE ONE TABLET (100 MG) BY MOUTH EVERY DAY   tirzepatide (MOUNJARO) 10 MG/0.5ML Pen Inject 10 mg into the skin once a week.   No facility-administered medications prior to visit.    Review of Systems  {Labs (Optional):23779}   Objective    LMP 02/28/2020 (Exact Date)  BP Readings from Last 3 Encounters:  06/29/21 133/73  03/31/21 (!) 148/83  03/03/21 135/76    Wt Readings from Last 3 Encounters:  06/29/21 215 lb 14.4 oz (97.9 kg)  03/31/21 219 lb 14.4 oz (99.7 kg)  03/03/21 218 lb 11.2 oz (99.2 kg)    Physical Exam  ***  No results found for any visits on 10/22/21.  Assessment & Plan     Problem List Items Addressed This Visit   None    No follow-ups on file.         Ronnell Freshwater, NP  [facility] at [facility] [phone]  (phone) [phone] (fax)  [place]

## 2021-10-22 ENCOUNTER — Ambulatory Visit (INDEPENDENT_AMBULATORY_CARE_PROVIDER_SITE_OTHER): Payer: No Typology Code available for payment source | Admitting: Nurse Practitioner

## 2021-10-28 ENCOUNTER — Ambulatory Visit: Payer: No Typology Code available for payment source | Admitting: Nurse Practitioner

## 2021-11-10 ENCOUNTER — Other Ambulatory Visit: Payer: Self-pay | Admitting: Nurse Practitioner

## 2021-11-10 DIAGNOSIS — I10 Essential (primary) hypertension: Secondary | ICD-10-CM

## 2021-11-14 IMAGING — US US PELVIS COMPLETE WITH TRANSVAGINAL
1 series · 14 of 25 positions shown · non-contrast
Comparison: None

CLINICAL DATA: Abnormal uterine bleeding

EXAM:
TRANSABDOMINAL AND TRANSVAGINAL ULTRASOUND OF PELVIS
TECHNIQUE: Both transabdominal and transvaginal ultrasound examinations of the
pelvis were performed. Transabdominal technique was performed for
global imaging of the pelvis including uterus, ovaries, adnexal
regions, and pelvic cul-de-sac. It was necessary to proceed with
endovaginal exam following the transabdominal exam to visualize the
uterus endometrium ovaries.

[Series 1: us pelvic complete with transvaginal · 14 of 82 slices shown]
[im 1/82]
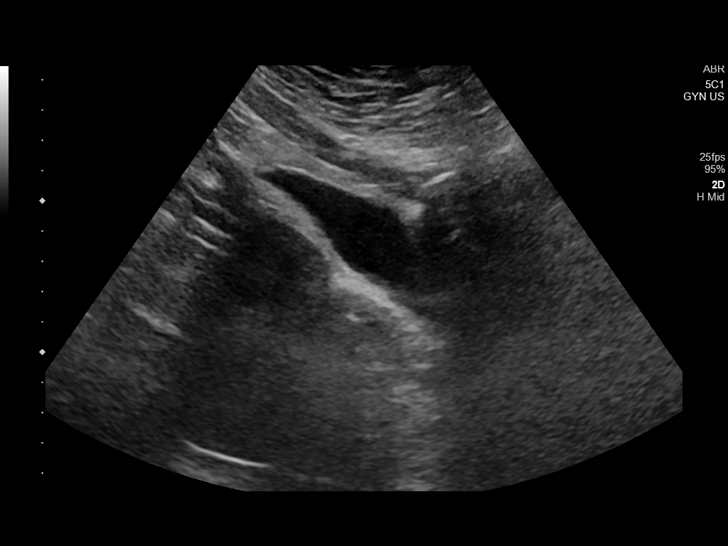
[im 7/82]
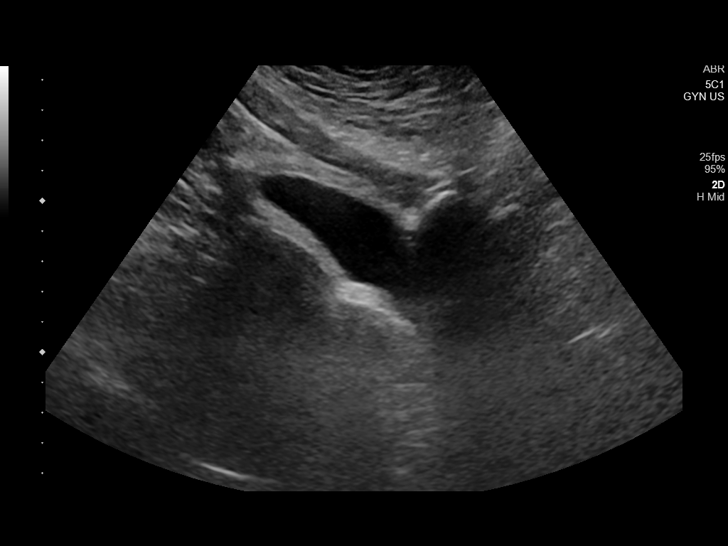
[im 14/82]
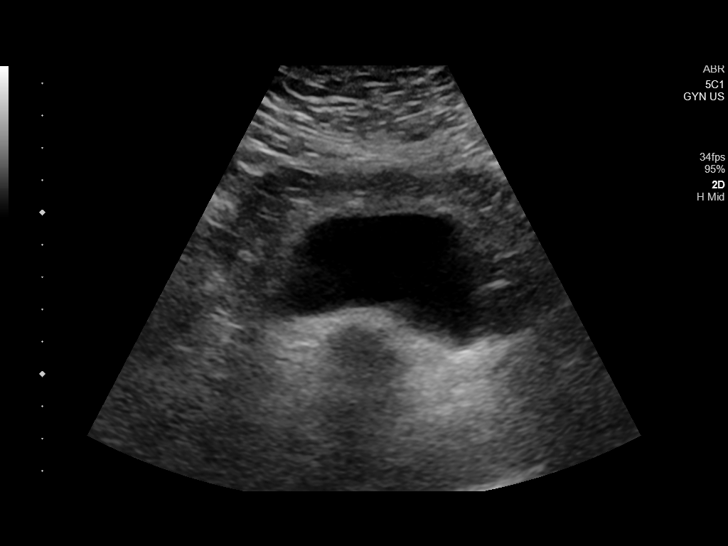
[im 21/82]
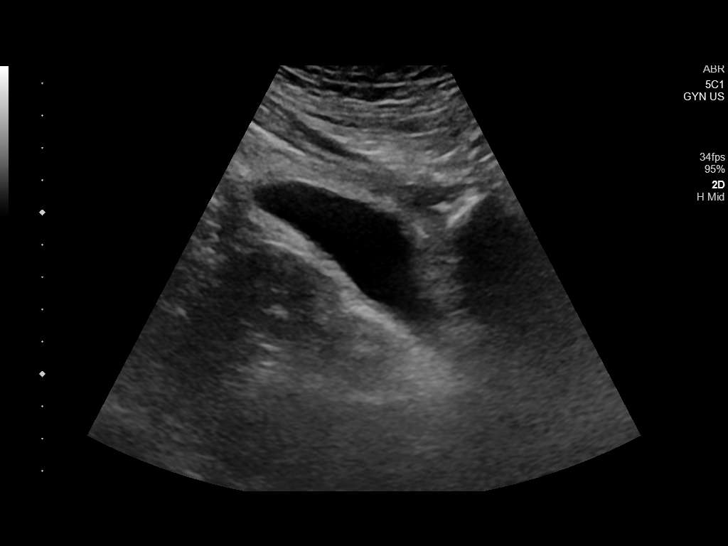
[im 28/82]
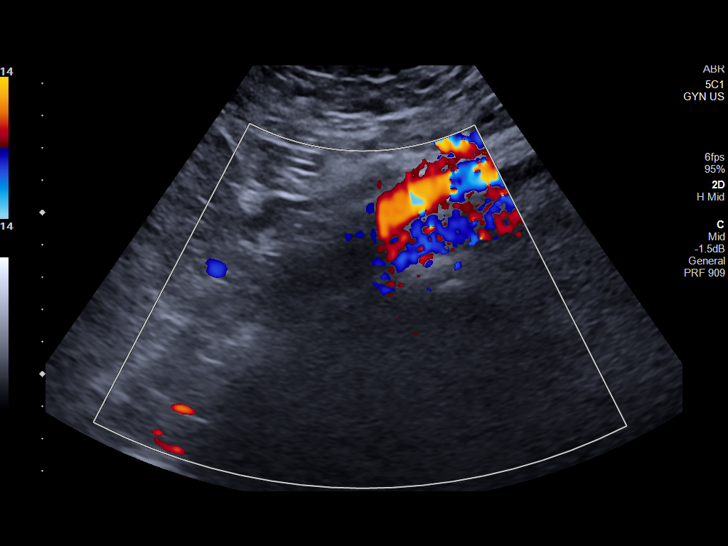
[im 31/82]
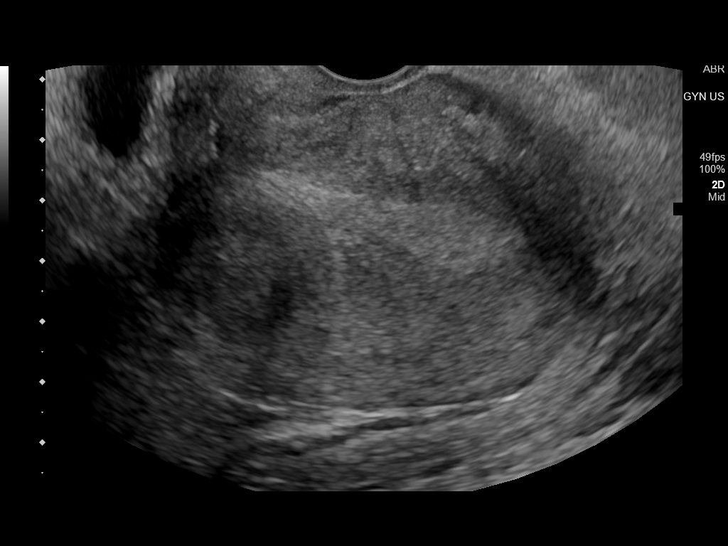
[im 38/82]
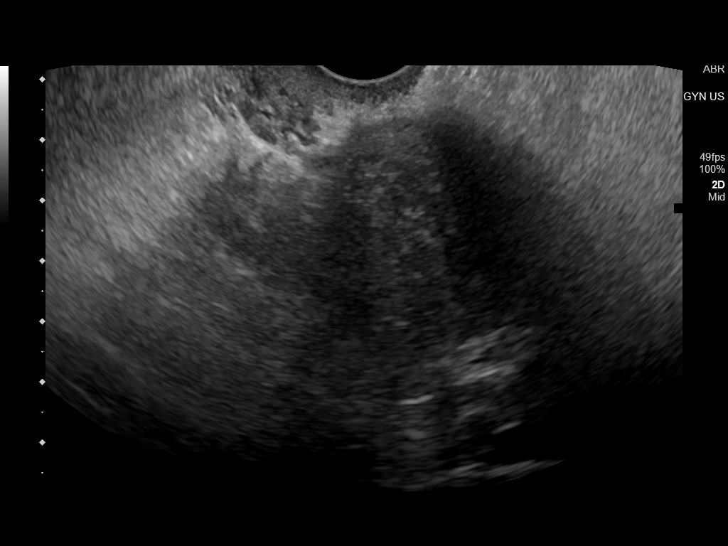
[im 44/82]
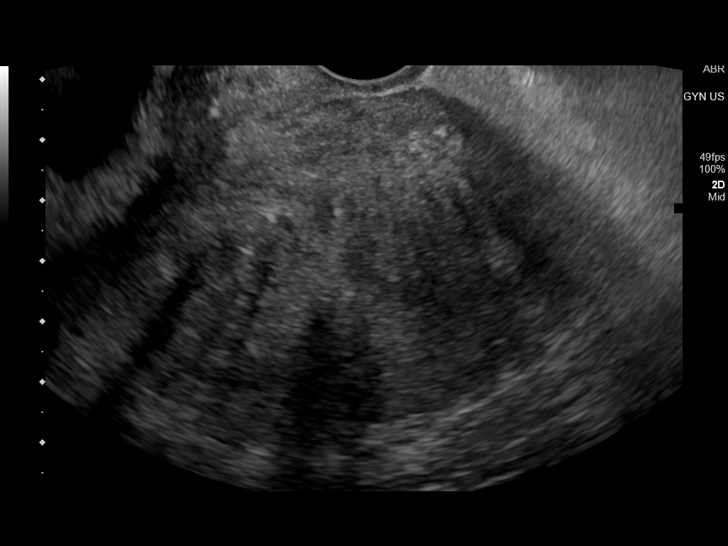
[im 51/82]
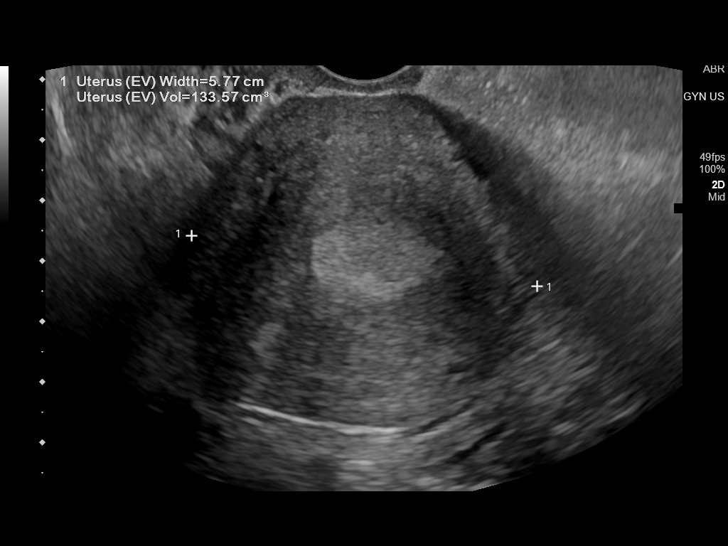
[im 55/82]
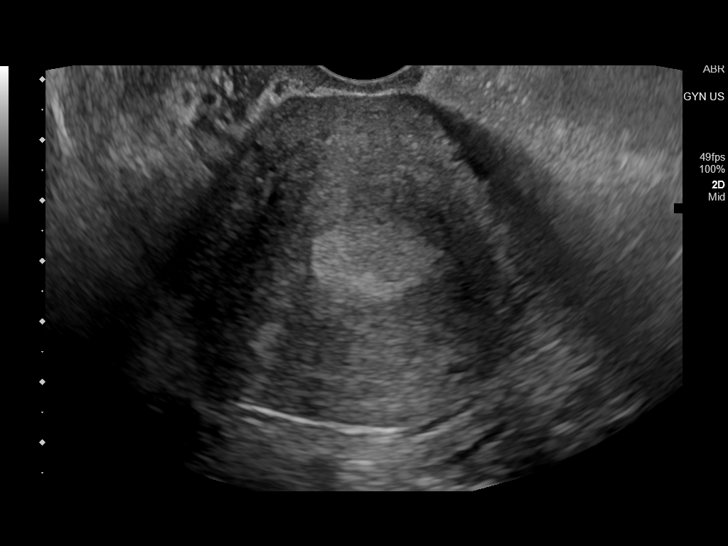
[im 61/82]
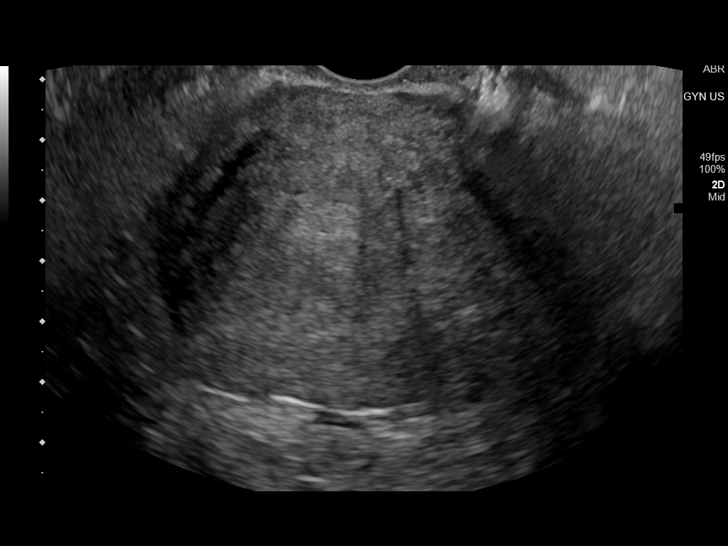
[im 68/82]
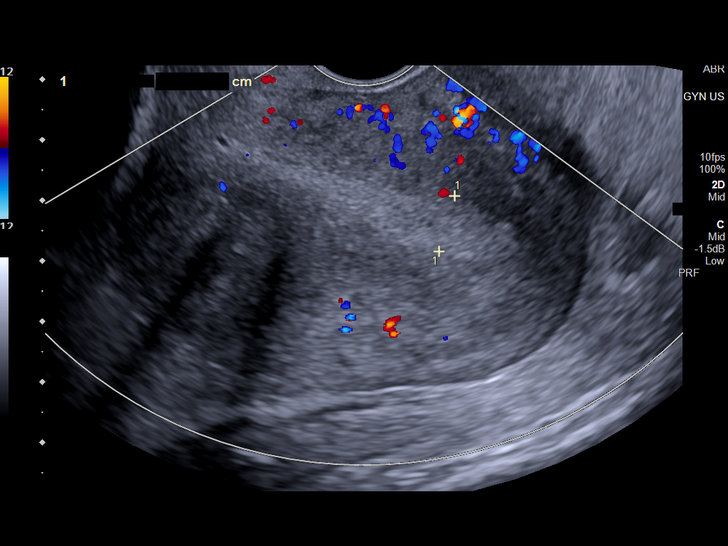
[im 75/82]
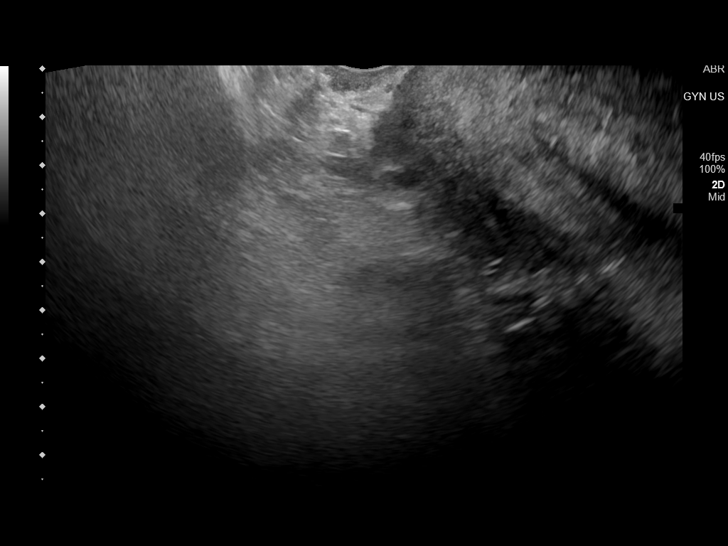
[im 82/82]
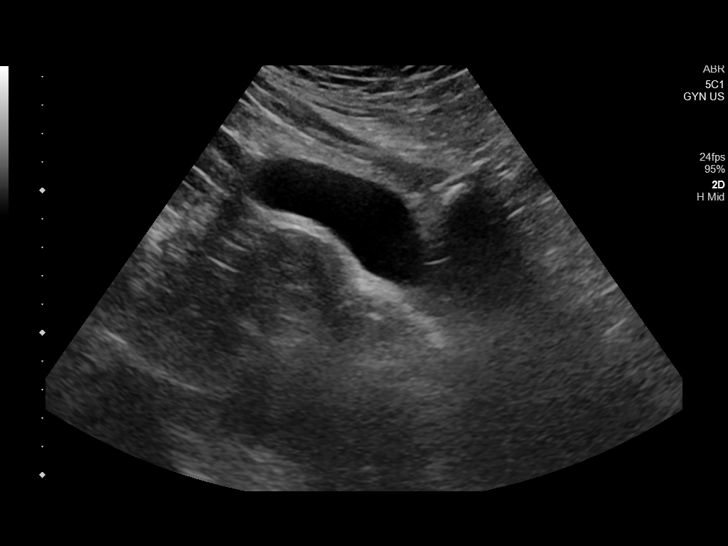

[14 of 25 positions shown; findings below may reference images not displayed]

FINDINGS: Uterus

Measurements: 7.8 x 5.7 x 5.8 cm = volume: 133.6 mL. Large fundal
mass measuring 5.4 x 5 x 6 cm.

Endometrium

Thickness: 10 mm.  No focal abnormality visualized.

Right ovary

Not seen

Left ovary

Not seen.

Other findings

No abnormal free fluid.
IMPRESSION: 1. Dominant uterine fibroid measuring up to 6 cm.
2. Normal premenopausal endometrial thickness.
3. Nonvisualized ovaries

## 2021-11-25 ENCOUNTER — Ambulatory Visit: Payer: No Typology Code available for payment source | Admitting: Nurse Practitioner

## 2021-11-25 ENCOUNTER — Encounter: Payer: Self-pay | Admitting: Nurse Practitioner

## 2021-11-25 VITALS — BP 127/73 | HR 60 | Ht 65.0 in | Wt 211.8 lb

## 2021-11-25 DIAGNOSIS — I10 Essential (primary) hypertension: Secondary | ICD-10-CM | POA: Diagnosis not present

## 2021-11-25 DIAGNOSIS — R110 Nausea: Secondary | ICD-10-CM | POA: Insufficient documentation

## 2021-11-25 DIAGNOSIS — E1165 Type 2 diabetes mellitus with hyperglycemia: Secondary | ICD-10-CM | POA: Diagnosis not present

## 2021-11-25 DIAGNOSIS — Z1231 Encounter for screening mammogram for malignant neoplasm of breast: Secondary | ICD-10-CM

## 2021-11-25 DIAGNOSIS — F320 Major depressive disorder, single episode, mild: Secondary | ICD-10-CM

## 2021-11-25 LAB — POCT GLYCOSYLATED HEMOGLOBIN (HGB A1C): Hemoglobin A1C: 5.5 % (ref 4.0–5.6)

## 2021-11-25 MED ORDER — ESCITALOPRAM OXALATE 10 MG PO TABS: ORAL_TABLET | ORAL | 1 refills | Status: DC

## 2021-11-25 MED ORDER — ONDANSETRON HCL 8 MG PO TABS: 8.0000 mg | ORAL_TABLET | Freq: Every day | ORAL | 1 refills | Status: DC | PRN

## 2021-11-25 MED ORDER — HYDROCHLOROTHIAZIDE 12.5 MG PO TABS: 12.5000 mg | ORAL_TABLET | Freq: Every day | ORAL | 3 refills | Status: DC

## 2021-11-25 MED ORDER — LOSARTAN POTASSIUM 100 MG PO TABS: ORAL_TABLET | ORAL | 3 refills | Status: DC

## 2021-11-25 MED ORDER — TIRZEPATIDE 10 MG/0.5ML ~~LOC~~ SOAJ: 10.0000 mg | SUBCUTANEOUS | 1 refills | Status: DC

## 2021-11-25 NOTE — Progress Notes (Signed)
Established patient visit   Patient: [name]   DOB: Feb 16, 1966   56 y.o. Female  MRN: [id number] Visit Date: 11/25/2021   Chief Complaint  Patient presents with   Follow-up   Subjective    HPI  Follow up visit  -type 2 diabetes -mounjaro increased to 10 mg weekly. She states that going up to this dose made her feel very nauseated. She did, ultimately, go back to 7.5 mg weekly. Was not able to eat due to the nausea.  --check HgbA1c today. It is 5.5 today.  -she is discouraged about slow weight loss. She has lost 4 pounds since last seen. Is taking boxing classes three days per week.  --needs referral for diabetic eye exam  -blood pressure well controlled today  -due for mammogram  -has bilateral wrist pain. This is mostly at the base of the thumb along the palmar side. She does take tylenol/ibuprofen for the pain. Has used voltaren gel.      Medications: Outpatient Medications Prior to Visit  Medication Sig   Accu-Chek Softclix Lancets lancets Use as instructed one a day e11.65   ascorbic acid (VITAMIN C) 500 MG tablet Take by mouth.   clotrimazole-betamethasone (LOTRISONE) cream Apply 1 application topically 2 (two) times daily.   cyanocobalamin 1000 MCG tablet Take by mouth.   fluconazole (DIFLUCAN) 150 MG tablet Take 1 tablet po once. May repeat dose in 3 days as needed for persistent symptoms.   glucose blood (ACCU-CHEK GUIDE) test strip Use as instructed Once a day dx e11.65   [DISCONTINUED] escitalopram (LEXAPRO) 10 MG tablet TAKE 1 1/2 TABLETS EVERY DAY   [DISCONTINUED] hydrochlorothiazide (HYDRODIURIL) 12.5 MG tablet TAKE 1 TABLET BY MOUTH DAILY.   [DISCONTINUED] losartan (COZAAR) 100 MG tablet TAKE ONE TABLET (100 MG) BY MOUTH EVERY DAY   [DISCONTINUED] tirzepatide (MOUNJARO) 10 MG/0.5ML Pen Inject 10 mg into the skin once a week.   No facility-administered medications prior to visit.    Review of Systems  Constitutional:  Negative for activity change, appetite  change, chills, fatigue and fever.  HENT:  Negative for congestion, postnasal drip, rhinorrhea, sinus pressure, sinus pain, sneezing and sore throat.   Eyes: Negative.   Respiratory:  Negative for cough, chest tightness, shortness of breath and wheezing.   Cardiovascular:  Negative for chest pain and palpitations.  Gastrointestinal:  Negative for abdominal pain, constipation, diarrhea, nausea and vomiting.  Endocrine: Negative for cold intolerance, heat intolerance, polydipsia and polyuria.       Blood sugars doing well    Genitourinary:  Negative for dyspareunia, dysuria, flank pain, frequency and urgency.  Musculoskeletal:  Positive for arthralgias. Negative for back pain and myalgias.       Bilateral wrist and hand pain. This is at the just below the thumb on palmer surface of the hand.   Skin:  Negative for rash.  Allergic/Immunologic: Negative for environmental allergies.  Neurological:  Negative for dizziness, weakness and headaches.  Hematological:  Negative for adenopathy.  Psychiatric/Behavioral:  The patient is not nervous/anxious.        Patient states that she has been feeling more down, off and on, these past few months.       Objective     Today's Vitals   11/25/21 1309  BP: 127/73  Pulse: 60  SpO2: 96%  Weight: 211 lb 12.8 oz (96.1 kg)  Height: '5\' 5"'$  (1.651 m)   Body mass index is 35.25 kg/m.   BP Readings from Last 3 Encounters:  11/25/21 127/73  06/29/21 133/73  03/31/21 (!) 148/83    Wt Readings from Last 3 Encounters:  11/25/21 211 lb 12.8 oz (96.1 kg)  06/29/21 215 lb 14.4 oz (97.9 kg)  03/31/21 219 lb 14.4 oz (99.7 kg)    Physical Exam Vitals and nursing note reviewed.  Constitutional:      Appearance: Normal appearance. She is well-developed.  HENT:     Head: Normocephalic and atraumatic.  Eyes:     Pupils: Pupils are equal, round, and reactive to light.  Neck:     Vascular: No carotid bruit.  Cardiovascular:     Rate and Rhythm: Normal  rate and regular rhythm.     Pulses: Normal pulses.     Heart sounds: Normal heart sounds.  Pulmonary:     Effort: Pulmonary effort is normal.     Breath sounds: Normal breath sounds.  Abdominal:     Palpations: Abdomen is soft.  Musculoskeletal:        General: Normal range of motion.       Arms:     Cervical back: Normal range of motion and neck supple.  Lymphadenopathy:     Cervical: No cervical adenopathy.  Skin:    General: Skin is warm and dry.     Capillary Refill: Capillary refill takes less than 2 seconds.  Neurological:     General: No focal deficit present.     Mental Status: She is alert and oriented to person, place, and time.  Psychiatric:        Mood and Affect: Mood normal.        Behavior: Behavior normal.        Thought Content: Thought content normal.        Judgment: Judgment normal.     Results for orders placed or performed in visit on 11/25/21  POCT glycosylated hemoglobin (Hb A1C)  Result Value Ref Range   Hemoglobin A1C 5.5 4.0 - 5.6 %   HbA1c POC (<> result, manual entry)     HbA1c, POC (prediabetic range)     HbA1c, POC (controlled diabetic range)      Assessment & Plan    1. Type 2 diabetes mellitus with hyperglycemia, without long-term current use of insulin (HCC) HgbA1c 5.5 today. Will try again to increase mounjaro to 10 mg weekly. Give prescription for zofran 8 mg to take daily, if needed for nausea. Refer for diabetic eye exam.  - POCT glycosylated hemoglobin (Hb A1C) - tirzepatide (MOUNJARO) 10 MG/0.5ML Pen; Inject 10 mg into the skin once a week.  Dispense: 6 mL; Refill: 1 - Ambulatory referral to Ophthalmology  2. Nausea Patient having nausea after increased dose of Mounjaro to 10 mg. Will add zofran 8 mg to take daily if needed for acute nausea.  - ondansetron (ZOFRAN) 8 MG tablet; Take 1 tablet (8 mg total) by mouth daily as needed for nausea or vomiting.  Dispense: 20 tablet; Refill: 1  3. Essential hypertension Well managed.  Continue losartan as prescribed  - losartan (COZAAR) 100 MG tablet; TAKE ONE TABLET (100 MG) BY MOUTH EVERY DAY  Dispense: 90 tablet; Refill: 3 - hydrochlorothiazide (HYDRODIURIL) 12.5 MG tablet; Take 1 tablet (12.5 mg total) by mouth daily.  Dispense: 90 tablet; Refill: 3  4. Depression, major, single episode, mild (HCC) Will do trial of lexapro 10 mg, taking twice daily.  - escitalopram (LEXAPRO) 10 MG tablet; Ttake 1 tablet bid  Dispense: 180 tablet; Refill: 1  5. Encounter for screening mammogram  for malignant neoplasm of breast Order for screening mammogram placed today. Patient understands to call and make appointment.  - MM DIGITAL SCREENING BILATERAL; Future   Problem List Items Addressed This Visit       Cardiovascular and Mediastinum   Essential hypertension   Relevant Medications   losartan (COZAAR) 100 MG tablet   hydrochlorothiazide (HYDRODIURIL) 12.5 MG tablet     Endocrine   Type 2 diabetes mellitus with hyperglycemia, without long-term current use of insulin (HCC) - Primary   Relevant Medications   tirzepatide (MOUNJARO) 10 MG/0.5ML Pen   losartan (COZAAR) 100 MG tablet   Other Relevant Orders   POCT glycosylated hemoglobin (Hb A1C) (Completed)   Ambulatory referral to Ophthalmology     Other   Depression, major, single episode, mild (HCC)   Relevant Medications   escitalopram (LEXAPRO) 10 MG tablet   Screening for breast cancer   Relevant Orders   MM DIGITAL SCREENING BILATERAL   Nausea   Relevant Medications   ondansetron (ZOFRAN) 8 MG tablet     Return in about 4 months (around 03/27/2022) for health maintenance exam, check HgbA1c, Tdap .         Ronnell Freshwater, NP  [facility] at [facility] [phone] (phone) [phone] (fax)  [place]

## 2021-12-29 ENCOUNTER — Telehealth: Payer: Self-pay

## 2021-12-29 ENCOUNTER — Other Ambulatory Visit: Payer: Self-pay | Admitting: Nurse Practitioner

## 2021-12-29 DIAGNOSIS — E1165 Type 2 diabetes mellitus with hyperglycemia: Secondary | ICD-10-CM

## 2021-12-29 MED ORDER — TIRZEPATIDE 12.5 MG/0.5ML ~~LOC~~ SOAJ: 12.5000 mg | SUBCUTANEOUS | 2 refills | Status: DC

## 2021-12-29 NOTE — Progress Notes (Signed)
Increased dose mounjaro to 12.5 mg weekly and [facility]

## 2021-12-29 NOTE — Telephone Encounter (Signed)
Patient called office to see if she could get her mounjaro sent to her pharmacy, patients' pharmacy is out of '10mg'$  and wanted to see if she could get 12.5 or 15 mg, please advise, thanks!

## 2021-12-29 NOTE — Telephone Encounter (Signed)
Called pt she is advised of her Rx that was sent

## 2021-12-29 NOTE — Telephone Encounter (Signed)
Please let her know that I Increased dose mounjaro to 12.5 mg weekly and sent new prescription to [facility]  Thanks so much.   -HB

## 2022-01-06 ENCOUNTER — Ambulatory Visit
Admission: RE | Admit: 2022-01-06 | Discharge: 2022-01-06 | Disposition: A | Discharge status: Home / Self Care | Payer: No Typology Code available for payment source | Source: Ambulatory Visit | Attending: Nurse Practitioner | Admitting: Nurse Practitioner

## 2022-01-06 DIAGNOSIS — Z1231 Encounter for screening mammogram for malignant neoplasm of breast: Secondary | ICD-10-CM | POA: Diagnosis not present

## 2022-01-07 NOTE — Progress Notes (Signed)
Negative mammogram

## 2022-01-11 NOTE — Progress Notes (Signed)
Patient left before being seen by provider

## 2022-02-16 ENCOUNTER — Other Ambulatory Visit: Payer: Self-pay | Admitting: Nurse Practitioner

## 2022-02-16 DIAGNOSIS — B379 Candidiasis, unspecified: Secondary | ICD-10-CM

## 2022-02-17 ENCOUNTER — Other Ambulatory Visit: Payer: Self-pay | Admitting: Nurse Practitioner

## 2022-02-17 DIAGNOSIS — E1165 Type 2 diabetes mellitus with hyperglycemia: Secondary | ICD-10-CM

## 2022-03-31 ENCOUNTER — Encounter: Payer: No Typology Code available for payment source | Admitting: Nurse Practitioner

## 2022-05-06 ENCOUNTER — Other Ambulatory Visit: Payer: Self-pay | Admitting: Nurse Practitioner

## 2022-05-06 DIAGNOSIS — R110 Nausea: Secondary | ICD-10-CM

## 2022-05-18 ENCOUNTER — Other Ambulatory Visit: Payer: Self-pay | Admitting: Nurse Practitioner

## 2022-05-18 ENCOUNTER — Telehealth: Payer: Self-pay | Admitting: *Deleted

## 2022-05-18 DIAGNOSIS — E1165 Type 2 diabetes mellitus with hyperglycemia: Secondary | ICD-10-CM

## 2022-05-18 MED ORDER — TIRZEPATIDE 15 MG/0.5ML ~~LOC~~ SOAJ: 15.0000 mg | SUBCUTANEOUS | 1 refills | Status: DC

## 2022-05-18 NOTE — Telephone Encounter (Signed)
Pt calling to say that she is on the 12.5 mg Mounjaro and that that dose is currently not available and will not be available for at least 6 weeks.  Patient wants to know if provider would be willing to send in the 74m.  She said the pharmacy could get that in a couple days. Please send to [facility] if this can be done and inform patient what provider chooses to do. Elisabetta Mishra Zimmerman Rumple, CMA

## 2022-05-18 NOTE — Telephone Encounter (Signed)
Please let the patient know  that I have increased mounjaro to 15 mg weekly and sent this to her pharmacy. Thanks  -HB

## 2022-05-18 NOTE — Telephone Encounter (Signed)
Pt informed of below.[name], CMA ? ?

## 2022-06-09 ENCOUNTER — Other Ambulatory Visit: Payer: Self-pay | Admitting: Nurse Practitioner

## 2022-06-09 DIAGNOSIS — R110 Nausea: Secondary | ICD-10-CM

## 2022-06-09 MED ORDER — ONDANSETRON HCL 8 MG PO TABS: 8.0000 mg | ORAL_TABLET | Freq: Every day | ORAL | 1 refills | Status: DC | PRN

## 2022-06-22 NOTE — Progress Notes (Signed)
Established patient visit   Patient: [name]   DOB: [birth year]   57 y.o. Female  MRN: [id number] Visit Date: 06/23/2022   Chief Complaint  Patient presents with   Medical Management of Chronic Issues   Subjective    HPI  Follow up  -type 2 diabetes  --HgbA1c today is 6.0  --up slightly from last visit. She is having trouble getting the 15 mg dose. Goes 2 to 3 weeks without it sometimes. Currently without it.  -due for urine microalbumin  -hypertension  --generally well controlled.  -due for Tdap today  -having situational stress.  --friend passed away last night from malignant tumor on the spine.  -She denies chest pain, chest pressure, or shortness of breath. She denies headaches or visual disturbances. she denies abdominal pain, nausea, vomiting, or changes in bowel or bladder habits.    Medications: Outpatient Medications Prior to Visit  Medication Sig   Accu-Chek Softclix Lancets lancets Use as instructed one a day e11.65   albuterol (VENTOLIN HFA) 108 (90 Base) MCG/ACT inhaler Inhale into the lungs.   clotrimazole-betamethasone (LOTRISONE) cream Apply 1 application topically 2 (two) times daily.   escitalopram (LEXAPRO) 10 MG tablet Ttake 1 tablet bid   glucose blood (ACCU-CHEK GUIDE) test strip Use as instructed Once a day dx e11.65   hydrochlorothiazide (HYDRODIURIL) 12.5 MG tablet Take 1 tablet (12.5 mg total) by mouth daily.   losartan (COZAAR) 100 MG tablet TAKE ONE TABLET (100 MG) BY MOUTH EVERY DAY   ondansetron (ZOFRAN) 8 MG tablet Take 1 tablet (8 mg total) by mouth daily as needed for nausea or vomiting.   tirzepatide (MOUNJARO) 15 MG/0.5ML Pen Inject 15 mg into the skin once a week.   [DISCONTINUED] fluconazole (DIFLUCAN) 150 MG tablet TAKE ONE TABLET MAY REPEAT IN 3 DAYS IF SYMPTOMS PERSIST   [DISCONTINUED] ascorbic acid (VITAMIN C) 500 MG tablet Take by mouth.   [DISCONTINUED] cyanocobalamin 1000 MCG tablet Take by mouth.   No facility-administered  medications prior to visit.    Review of Systems  See HPI     Objective     Today's Vitals   06/23/22 1020  BP: 125/73  Pulse: 86  SpO2: 94%  Weight: 213 lb 12.8 oz (97 kg)  Height: 5\' 5"  (1.651 m)   Body mass index is 35.58 kg/m.  BP Readings from Last 3 Encounters:  06/23/22 125/73  11/25/21 127/73  06/29/21 133/73    Wt Readings from Last 3 Encounters:  06/23/22 213 lb 12.8 oz (97 kg)  11/25/21 211 lb 12.8 oz (96.1 kg)  06/29/21 215 lb 14.4 oz (97.9 kg)    Physical Exam Vitals and nursing note reviewed.  Constitutional:      Appearance: Normal appearance. She is well-developed.  HENT:     Head: Normocephalic and atraumatic.     Nose: Nose normal.     Mouth/Throat:     Mouth: Mucous membranes are moist.     Pharynx: Oropharynx is clear.  Eyes:     Extraocular Movements: Extraocular movements intact.     Conjunctiva/sclera: Conjunctivae normal.     Pupils: Pupils are equal, round, and reactive to light.  Cardiovascular:     Rate and Rhythm: Normal rate and regular rhythm.     Pulses: Normal pulses.     Heart sounds: Normal heart sounds.  Pulmonary:     Effort: Pulmonary effort is normal.     Breath sounds: Normal breath sounds.  Abdominal:     Palpations:  Abdomen is soft.  Musculoskeletal:        General: Normal range of motion.     Cervical back: Normal range of motion and neck supple.  Lymphadenopathy:     Cervical: No cervical adenopathy.  Skin:    General: Skin is warm and dry.     Capillary Refill: Capillary refill takes less than 2 seconds.  Neurological:     General: No focal deficit present.     Mental Status: She is alert and oriented to person, place, and time.  Psychiatric:        Mood and Affect: Mood normal.        Behavior: Behavior normal.        Thought Content: Thought content normal.        Judgment: Judgment normal.      Results for orders placed or performed in visit on 06/23/22  POCT glycosylated hemoglobin (Hb A1C)   Result Value Ref Range   Hemoglobin A1C     HbA1c POC (<> result, manual entry) 6.0 4.0 - 5.6 %   HbA1c, POC (prediabetic range)     HbA1c, POC (controlled diabetic range)    POCT UA - Microalbumin  Result Value Ref Range   Microalbumin Ur, POC 80 mg/L   Creatinine, POC 200 mg/dL   Albumin/Creatinine Ratio, Urine, POC 30-300     Assessment & Plan    Type 2 diabetes mellitus with hyperglycemia, without long-term current use of insulin (HCC) Assessment & Plan: HgbA1c 6.0 with abnormal urine microalbumin. Continue Mounjaro 15 mg weekly. Limit carbohydrates and sugar in the diet and increase daily water intake. Recheck HgbA1c in 4 months.   Orders: -     POCT glycosylated hemoglobin (Hb A1C) -     POCT UA - Microalbumin -     CT CORONARY MORPH W/CTA COR W/SCORE W/CA W/CM &/OR WO/CM; Future  Hypertension associated with type 2 diabetes mellitus ([place]) Assessment & Plan: Blood pressure stable. Continue medication as prescribed   Orders: -     CT CORONARY MORPH W/CTA COR W/SCORE W/CA W/CM &/OR WO/CM; Future  Antibiotic-induced yeast infection Assessment & Plan: May take diflucan as needed and as prescribed   Orders: -     Fluconazole; TAKE ONE TABLET MAY REPEAT IN 3 DAYS IF SYMPTOMS PERSIST  Dispense: 3 tablet; Refill: 2  Family history of premature CAD -     CT CORONARY MORPH W/CTA COR W/SCORE W/CA W/CM &/OR WO/CM; Future  Need for tetanus, diphtheria, and acellular pertussis (Tdap) vaccine -     Tdap vaccine greater than or equal to 7yo IM        Return in about 4 months (around 10/23/2022) for health maintenance exam, diabetes with HgbA1c check, FBW at time of visit.         Ronnell Freshwater, NP  [facility] at [facility] [phone] (phone) [phone] (fax)  [place]

## 2022-06-23 ENCOUNTER — Ambulatory Visit: Payer: No Typology Code available for payment source | Admitting: Nurse Practitioner

## 2022-06-23 ENCOUNTER — Encounter: Payer: Self-pay | Admitting: Nurse Practitioner

## 2022-06-23 VITALS — BP 125/73 | HR 86 | Ht 65.0 in | Wt 213.8 lb

## 2022-06-23 DIAGNOSIS — E1159 Type 2 diabetes mellitus with other circulatory complications: Secondary | ICD-10-CM | POA: Diagnosis not present

## 2022-06-23 DIAGNOSIS — Z23 Encounter for immunization: Secondary | ICD-10-CM | POA: Diagnosis not present

## 2022-06-23 DIAGNOSIS — B379 Candidiasis, unspecified: Secondary | ICD-10-CM | POA: Diagnosis not present

## 2022-06-23 DIAGNOSIS — Z8249 Family history of ischemic heart disease and other diseases of the circulatory system: Secondary | ICD-10-CM

## 2022-06-23 DIAGNOSIS — I152 Hypertension secondary to endocrine disorders: Secondary | ICD-10-CM | POA: Diagnosis not present

## 2022-06-23 DIAGNOSIS — E1165 Type 2 diabetes mellitus with hyperglycemia: Secondary | ICD-10-CM | POA: Diagnosis not present

## 2022-06-23 DIAGNOSIS — T3695XA Adverse effect of unspecified systemic antibiotic, initial encounter: Secondary | ICD-10-CM

## 2022-06-23 DIAGNOSIS — I10 Essential (primary) hypertension: Secondary | ICD-10-CM

## 2022-06-23 LAB — POCT GLYCOSYLATED HEMOGLOBIN (HGB A1C): HbA1c POC (<> result, manual entry): 6 % (ref 4.0–5.6)

## 2022-06-23 LAB — POCT UA - MICROALBUMIN
Creatinine, POC: 200 mg/dL
Microalbumin Ur, POC: 80 mg/L

## 2022-06-23 MED ORDER — FLUCONAZOLE 150 MG PO TABS: ORAL_TABLET | ORAL | 2 refills | Status: DC

## 2022-06-29 DIAGNOSIS — Z8249 Family history of ischemic heart disease and other diseases of the circulatory system: Secondary | ICD-10-CM | POA: Insufficient documentation

## 2022-06-29 NOTE — Assessment & Plan Note (Signed)
May take diflucan as needed and as prescribed

## 2022-06-29 NOTE — Assessment & Plan Note (Signed)
Blood pressure stable. Continue medication as prescribed  °

## 2022-06-29 NOTE — Assessment & Plan Note (Signed)
CT coronary artery/CT gated calcium scoring test ordered for further evaluation.

## 2022-06-29 NOTE — Assessment & Plan Note (Signed)
HgbA1c 6.0 with abnormal urine microalbumin. Continue Mounjaro 15 mg weekly. Limit carbohydrates and sugar in the diet and increase daily water intake. Recheck HgbA1c in 4 months.

## 2022-07-14 ENCOUNTER — Other Ambulatory Visit (HOSPITAL_COMMUNITY): Payer: Self-pay | Admitting: Nurse Practitioner

## 2022-07-14 DIAGNOSIS — I2510 Atherosclerotic heart disease of native coronary artery without angina pectoris: Secondary | ICD-10-CM

## 2022-07-19 ENCOUNTER — Ambulatory Visit
Admission: RE | Admit: 2022-07-19 | Discharge: 2022-07-19 | Disposition: A | Discharge status: Home / Self Care | Payer: No Typology Code available for payment source | Source: Ambulatory Visit | Attending: Nurse Practitioner | Admitting: Nurse Practitioner

## 2022-07-19 ENCOUNTER — Ambulatory Visit: Admission: RE | Admit: 2022-07-19 | Payer: No Typology Code available for payment source | Source: Ambulatory Visit

## 2022-07-19 ENCOUNTER — Encounter: Payer: Self-pay | Admitting: Nurse Practitioner

## 2022-07-19 DIAGNOSIS — I2510 Atherosclerotic heart disease of native coronary artery without angina pectoris: Secondary | ICD-10-CM

## 2022-08-11 ENCOUNTER — Telehealth: Payer: Self-pay | Admitting: *Deleted

## 2022-08-11 NOTE — Telephone Encounter (Signed)
Pt calling stating that she had to have her mounjaro transferred to [facility] on [address] due to [facility] not being able to get medication and [facility] stated they are waiting for a PA, informed pt that the PA was cancelled and that Katrina has faxed over information to the Case Management team and is awaiting the outcome for this.  Pt would like to be updated once we hear something back from them.

## 2022-08-11 NOTE — Telephone Encounter (Signed)
PA was done through [employer] but it was returned back stating that it couldn't be done a number was provider so the PA was done on 08/10/2022 by Case Management Tam and faxed the same day now waiting on a response

## 2022-08-26 ENCOUNTER — Telehealth: Payer: Self-pay

## 2022-08-26 NOTE — Telephone Encounter (Signed)
Patient called office to see If she could get an alternative for mounjaro, please advise, thanks.

## 2022-09-07 NOTE — Telephone Encounter (Signed)
I don't think we ever figured out the problem with mounjaro. Are they still not filling it? Should I send a new mounjaro prescription?

## 2022-09-20 ENCOUNTER — Other Ambulatory Visit: Payer: Self-pay | Admitting: Nurse Practitioner

## 2022-09-20 DIAGNOSIS — F320 Major depressive disorder, single episode, mild: Secondary | ICD-10-CM

## 2022-11-01 ENCOUNTER — Encounter: Payer: Self-pay | Admitting: Family Medicine

## 2022-11-01 ENCOUNTER — Telehealth: Payer: No Typology Code available for payment source | Admitting: Family Medicine

## 2022-11-01 VITALS — Ht 65.0 in | Wt 200.0 lb

## 2022-11-01 DIAGNOSIS — A084 Viral intestinal infection, unspecified: Secondary | ICD-10-CM | POA: Insufficient documentation

## 2022-11-01 MED ORDER — LIDOCAINE-PRILOCAINE 2.5-2.5 % EX CREA: 1.0000 | TOPICAL_CREAM | CUTANEOUS | 1 refills | Status: DC | PRN

## 2022-11-01 NOTE — Progress Notes (Signed)
   Acute Office Visit  Subjective:     Patient ID: [name], female    DOB: [birth year], 56 y.o.   MRN: [id number]  No chief complaint on file.   HPI Patient is in today for vomiting, diarrhea.  Patient started having vomiting and diarrhea on Thursday and Friday.  Vomiting resolved on Saturday.  Still having diarrhea.  Going several times a day, greater than 4 times as of yesterday.  Diarrhea is liquid, brown.  Nonbloody.  She is having to wake up at night to have bowel movements.  Has been putting Desitin and hemorrhoid cream on it for the soreness.  Also started taking Imodium which helped somewhat.  No history of chronic diarrhea.  Denied fever or chills but did wake up "drenched in sweat" 1 night.  No one else in the family is sick.  No COVID test done.  No recent antibiotic use.  Has been eating saltines and toast the last few days.    ROS      Objective:    Ht 5\' 5"  (1.651 m)   Wt 200 lb (90.7 kg)   LMP 02/28/2020 (Exact Date)   BMI 33.28 kg/m    Physical Exam  No results found for any visits on 11/01/22.      Assessment & Plan:   Viral gastroenteritis Assessment & Plan: Day 5 of diarrhea.  Vomiting resolved.  Likely viral gastroenteritis. - Encourage patient to stay hydrated - Emla cream prescription sent in for rectal pain - Advised patient to follow-up with [place] on Wednesday if diarrhea has not improved and we can send in order for stool sample.  Patient lives in [place] and would be able to pick it up from [facility] nearby - Advised patient to take COVID test so that she would know if she needed to quarantine from friends/family who are at high risk - Advised patient she can take Imodium if diarrhea continues to be severe but that this can prolong duration of symptoms.      No follow-ups on file.  Sandre Kitty, MD

## 2022-11-01 NOTE — Assessment & Plan Note (Signed)
Day 5 of diarrhea.  Vomiting resolved.  Likely viral gastroenteritis. - Encourage patient to stay hydrated - Emla cream prescription sent in for rectal pain - Advised patient to follow-up with [place] on Wednesday if diarrhea has not improved and we can send in order for stool sample.  Patient lives in [place] and would be able to pick it up from [facility] nearby - Advised patient to take COVID test so that she would know if she needed to quarantine from friends/family who are at high risk - Advised patient she can take Imodium if diarrhea continues to be severe but that this can prolong duration of symptoms.

## 2022-12-08 ENCOUNTER — Other Ambulatory Visit: Payer: Self-pay | Admitting: Nurse Practitioner

## 2022-12-08 ENCOUNTER — Other Ambulatory Visit: Payer: Self-pay | Admitting: Family Medicine

## 2022-12-08 DIAGNOSIS — I10 Essential (primary) hypertension: Secondary | ICD-10-CM

## 2022-12-08 DIAGNOSIS — R110 Nausea: Secondary | ICD-10-CM

## 2022-12-08 MED ORDER — HYDROCHLOROTHIAZIDE 12.5 MG PO TABS
12.5000 mg | ORAL_TABLET | Freq: Every day | ORAL | 3 refills | Status: AC
Start: 2022-12-08 — End: ?

## 2022-12-08 MED ORDER — LOSARTAN POTASSIUM 100 MG PO TABS: ORAL_TABLET | ORAL | 3 refills | Status: DC

## 2022-12-08 NOTE — Telephone Encounter (Signed)
Hey. I see she last had appointment with you. Can you send refills for these? Please and thank you. Herbert Seta

## 2023-01-19 ENCOUNTER — Other Ambulatory Visit: Payer: Self-pay | Admitting: Nurse Practitioner

## 2023-01-19 ENCOUNTER — Other Ambulatory Visit: Payer: Self-pay | Admitting: Family Medicine

## 2023-01-19 DIAGNOSIS — R110 Nausea: Secondary | ICD-10-CM

## 2023-03-07 ENCOUNTER — Encounter: Payer: No Typology Code available for payment source | Admitting: Nurse Practitioner

## 2023-06-02 ENCOUNTER — Encounter: Payer: Self-pay | Admitting: Family

## 2023-06-02 ENCOUNTER — Ambulatory Visit: Payer: No Typology Code available for payment source | Admitting: Family

## 2023-06-02 DIAGNOSIS — Z7985 Long-term (current) use of injectable non-insulin antidiabetic drugs: Secondary | ICD-10-CM

## 2023-06-02 DIAGNOSIS — R110 Nausea: Secondary | ICD-10-CM

## 2023-06-02 DIAGNOSIS — I10 Essential (primary) hypertension: Secondary | ICD-10-CM | POA: Diagnosis not present

## 2023-06-02 DIAGNOSIS — E1165 Type 2 diabetes mellitus with hyperglycemia: Secondary | ICD-10-CM | POA: Diagnosis not present

## 2023-06-02 LAB — POCT GLYCOSYLATED HEMOGLOBIN (HGB A1C): Hemoglobin A1C: 10 % — AB (ref 4.0–5.6)

## 2023-06-02 MED ORDER — TIRZEPATIDE 2.5 MG/0.5ML ~~LOC~~ SOAJ: 2.5000 mg | SUBCUTANEOUS | 3 refills | Status: DC

## 2023-06-02 MED ORDER — ONDANSETRON HCL 4 MG PO TABS: 4.0000 mg | ORAL_TABLET | Freq: Every day | ORAL | 1 refills | Status: AC | PRN

## 2023-06-02 NOTE — Patient Instructions (Addendum)
 Please restart mounjaro Please monitor for nausea.   If Greggory Keen is not covered through your insurance, you may go to the [email or web address] to complete information for savings card.   You may also use the link below.   [email or web address]  You may take this savings to [facility], [facility] or [facility]. If you are unable to get mounjaro, please call to schedule an appointment with me so we can discuss alternative weight loss medications.   start Mounjaro 2.5mg  once per week injected subcutaneously ( [place])  in stomach. Please clean with alcohol swab prior to injection and be sure to rotate site. You may schedule a nurse visit if you would like to first injection.   After 4 weeks, and if tolerated and weight loss has not reached 1-2 lbs per week, please increase to 5mg  once per week [place].    Please read information on medication below and remember black box warning that you may not take if you or a family member is diagnosed with thyroid cancer (medullary thyroid cancer), or multiple endocrine neoplasia.       Tirzepatide Injection Greggory Keen) What is this medication? TIRZEPATIDE (tir ZEP a tide) treats type 2 diabetes. It works by increasing insulin levels in your body, which decreases your blood sugar (glucose). Changes to diet and exercise are often combined with this medication. This medicine may be used for other purposes; ask your health care provider or pharmacist if you have questions. COMMON BRAND NAME(S): MOUNJARO What should I tell my care team before I take this medication? They need to know if you have any of these conditions: Endocrine tumors (MEN 2) or if someone in your family had these tumors Eye disease, vision problems Gallbladder disease History of pancreatitis Kidney disease Stomach or intestine problems Thyroid cancer or if someone in your family had thyroid cancer An unusual or allergic reaction to tirzepatide, other  medications, foods, dyes, or preservatives Pregnant or trying to get pregnant Breast-feeding How should I use this medication? This medication is injected under the skin. You will be taught how to prepare and give it. It is given once every week (every 7 days). Keep taking it unless your health care provider tells you to stop. If you use this medication with insulin, you should inject this medication and the insulin separately. Do not mix them together. Do not give the injections right next to each other. Change (rotate) injection sites with each injection. This medication comes with INSTRUCTIONS FOR USE. Ask your pharmacist for directions on how to use this medication. Read the information carefully. Talk to your pharmacist or care team if you have questions. It is important that you put your used needles and syringes in a special sharps container. Do not put them in a trash can. If you do not have a sharps container, call your pharmacist or care team to get one. A special MedGuide will be given to you by the pharmacist with each prescription and refill. Be sure to read this information carefully each time. Talk to your care team about the use of this medication in children. Special care may be needed. Overdosage: If you think you have taken too much of this medicine contact a poison control center or emergency room at once. NOTE: This medicine is only for you. Do not share this medicine with others. What if I miss a dose? If you miss a dose, take it as soon as you can unless it is more than 4 days (96 hours)  late. If it is more than 4 days late, skip the missed dose. Take the next dose at the normal time. Do not take 2 doses within 3 days of each other. What may interact with this medication? Alcohol containing beverages Antiviral medications for HIV or AIDS Aspirin and aspirin-like medications Beta-blockers like atenolol, metoprolol, propranolol Certain medications for blood pressure, heart  disease, irregular heart beat Chromium Clonidine Diuretics Female hormones, such as estrogens or progestins, birth control pills Fenofibrate Gemfibrozil Guanethidine Isoniazid Lanreotide Female hormones or anabolic steroids MAOIs like Carbex, Eldepryl, Marplan, Nardil, and Parnate Medications for weight loss Medications for allergies, asthma, cold, or cough Medications for depression, anxiety, or psychotic disturbances Niacin Nicotine NSAIDs, medications for pain and inflammation, like ibuprofen or naproxen Octreotide Other medications for diabetes, like glyburide, glipizide, or glimepiride Pasireotide Pentamidine Phenytoin Probenecid Quinolone antibiotics such as ciprofloxacin, levofloxacin, ofloxacin Reserpine Some herbal dietary supplements Steroid medications such as prednisone or cortisone Sulfamethoxazole; trimethoprim Thyroid hormones Warfarin This list may not describe all possible interactions. Give your health care provider a list of all the medicines, herbs, non-prescription drugs, or dietary supplements you use. Also tell them if you smoke, drink alcohol, or use illegal drugs. Some items may interact with your medicine. What should I watch for while using this medication? Visit your care team for regular checks on your progress. Drink plenty of fluids while taking this medication. Check with your care team if you get an attack of severe diarrhea, nausea, and vomiting. The loss of too much body fluid can make it dangerous for you to take this medication. A test called the HbA1C (A1C) will be monitored. This is a simple blood test. It measures your blood sugar control over the last 2 to 3 months. You will receive this test every 3 to 6 months. Learn how to check your blood sugar. Learn the symptoms of low and high blood sugar and how to manage them. Always carry a quick-source of sugar with you in case you have symptoms of low blood sugar. Examples include hard sugar  candy or glucose tablets. Make sure others know that you can choke if you eat or drink when you develop serious symptoms of low blood sugar, such as seizures or unconsciousness. They must get medical help at once. Tell your care team if you have high blood sugar. You might need to change the dose of your medication. If you are sick or exercising more than usual, you might need to change the dose of your medication. Do not skip meals. Ask your care team if you should avoid alcohol. Many nonprescription cough and cold products contain sugar or alcohol. These can affect blood sugar. Pens should never be shared. Even if the needle is changed, sharing may result in passing of viruses like hepatitis or HIV. Wear a medical ID bracelet or chain, and carry a card that describes your disease and details of your medication and dosage times. Birth control may not work properly while you are taking this medication. If you take birth control pills by mouth, your care team may recommend another type of birth control for 4 weeks after you start this medication and for 4 weeks after each increase in your dose of this medication. Ask your care team which birth control methods you should use. What side effects may I notice from receiving this medication? Side effects that you should report to your care team as soon as possible: Allergic reactions-skin rash, itching, hives, swelling of the face, lips, tongue,  or throat Change in vision Dehydration-increased thirst, dry mouth, feeling faint or lightheaded, headache, dark yellow or brown urine Gallbladder problems-severe stomach pain, nausea, vomiting, fever Kidney injury-decrease in the amount of urine, swelling of the ankles, hands, or feet Pancreatitis-severe stomach pain that spreads to your back or gets worse after eating or when touched, fever, nausea, vomiting Thyroid cancer-new mass or lump in the neck, pain or trouble swallowing, trouble breathing, hoarseness Side  effects that usually do not require medical attention (report these to your care team if they continue or are bothersome): Constipation Diarrhea Loss of Appetite Nausea Stomach pain Upset stomach Vomiting This list may not describe all possible side effects. Call your doctor for medical advice about side effects. You may report side effects to FDA at 1-800-FDA-1088. Where should I keep my medication? Keep out of the reach of children and pets. Refrigeration (preferred): Store unopened pens in a refrigerator between 2 and 8 degrees C (36 and 46 degrees F). Keep it in the original carton until you are ready to take it. Do not freeze or use if the medication has been frozen. Protect from light. Get rid of any unused medication after the expiration date on the label. Room Temperature: The pen may be stored at room temperature below 30 degrees C (86 degrees F) for up to a total of 21 days if needed. Protect from light. Avoid exposure to extreme heat. If it is stored at room temperature, throw away any unused medication after 21 days or after it expires, whichever is first. The pen has glass parts. Handle it carefully. If you drop the pen on a hard surface, do not use it. Use a new pen for your injection. To get rid of medications that are no longer needed or have expired: Take the medication to a medication take-back program. Check with your pharmacy or law enforcement to find a location. If you cannot return the medication, ask your pharmacist or care team how to get rid of this medication safely. NOTE: This sheet is a summary. It may not cover all possible information. If you have questions about this medicine, talk to your doctor, pharmacist, or health care provider.  2022 Elsevier/Gold Standard (2020-08-19 13:57:48)  Please download Myfitness Pal App ( basic version is free).   You may log every thing you eat for even 2-3 days to get a better of idea of total daily calories. To loose weight, we  have to create caloric deficit to loose weight. The goal is 1-2 lbs per week of weight loss.   Excellent article below from [facility].   https://www.health.CriticalZ.it  Calorie counting made easy  Eat less, exercise more. If only it were that simple! As most dieters know, losing weight can be very challenging. As this report details, a range of influences can affect how people gain and lose weight. But a basic understanding of how to tip your energy balance in favor of weight loss is a good place to start.  Start by determining how many calories you should consume each day. To do so, you need to know how many calories you need to maintain your current weight. Doing this requires a few simple calculations.  First, multiply your current weight by 15 -- that's roughly the number of calories per pound of body weight needed to maintain your current weight if you are moderately active. Moderately active means getting at least 30 minutes of physical activity a day in the form of exercise (walking at a  brisk pace, climbing stairs, or active gardening). Let's say you're a woman who is 5 feet, 4 inches tall and weighs 155 pounds, and you need to lose about 15 pounds to put you in a healthy weight range. If you multiply 155 by 15, you will get 2,325, which is the number of calories per day that you need in order to maintain your current weight (weight-maintenance calories). To lose weight, you will need to get below that total.  For example, to lose 1 to 2 pounds a week -- a rate that experts consider safe -- your food consumption should provide 500 to 1,000 calories less than your total weight-maintenance calories. If you need 2,325 calories a day to maintain your current weight, reduce your daily calories to between 1,325 and 1,825. If you are sedentary, you will also need to build more activity into your day. In order to lose at least a pound a week, try to do at  least 30 minutes of physical activity on most days, and reduce your daily calorie intake by at least 500 calories. However, calorie intake should not fall below 1,200 a day in women or 1,500 a day in men, except under the supervision of a health professional. Eating too few calories can endanger your health by depriving you of needed nutrients.  Meeting your calorie target How can you meet your daily calorie target? One approach is to add up the number of calories per serving of all the foods that you eat, and then plan your menus accordingly. You can buy books that list calories per serving for many foods. In addition, the nutrition labels on all packaged foods and beverages provide calories per serving information. Make a point of reading the labels of the foods and drinks you use, noting the number of calories and the serving sizes. Many recipes published in cookbooks, newspapers, and magazines provide similar information.  If you hate counting calories, a different approach is to restrict how much and how often you eat, and to eat meals that are low in calories. Dietary guidelines issued by the American Heart Association stress common sense in choosing your foods rather than focusing strictly on numbers, such as total calories or calories from fat. Whichever method you choose, research shows that a regular eating schedule -- with meals and snacks planned for certain times each day -- makes for the most successful approach. The same applies after you have lost weight and want to keep it off. Sticking with an eating schedule increases your chance of maintaining your new weight.    This is  Dr. Melina Schools  ( an amazing physician in my office!)  example of a  "Low GI"  Diet:  It will allow you to lose 4 to 8  lbs  per month if you follow it carefully.  Your goal with exercise is a minimum of 30 minutes of aerobic exercise 5 days per week (Walking does not count once it becomes easy!)    All of the foods can  be found at grocery stores and in bulk at [employer].  The Atkins protein bars and shakes are available in more varieties at Target, WalMart and [employer].     7 AM Breakfast:  Choose from the following:  Low carbohydrate Protein  Shakes (I recommend the  Premier Protein chocolate shakes,  EAS AdvantEdge "Carb Control" shakes  Or the Atkins shakes all are under 3 net carbs)     a scrambled egg/bacon/cheese burrito made  with [employer]'s "carb balance" whole wheat tortilla  (about 10 net carbs )  Medical laboratory scientific officer (basically a quiche without the pastry crust) that is eaten cold and very convenient way to get your eggs.  8 carbs)  If you make your own protein shakes, avoid bananas and pineapple,  And use low carb greek yogurt or original /unsweetened almond or soy milk    Avoid cereal and bananas, oatmeal and cream of wheat and grits. They are loaded with carbohydrates!   10 AM: high protein snack:  Protein bar by Atkins (the snack size, under 200 cal, usually < 6 net carbs).    A stick of cheese:  Around 1 carb,  100 cal     Dannon Light n Fit [place] Yogurt  (80 cal, 8 carbs)  Other so called "protein bars" and Greek yogurts tend to be loaded with carbohydrates.  Remember, in food advertising, the word "energy" is synonymous for " carbohydrate."  Lunch:   A Sandwich using the bread choices listed, Can use any  Eggs,  lunchmeat, grilled meat or canned tuna), avocado, regular mayo/mustard  and cheese.  A Salad using blue cheese, ranch,  Goddess or vinagrette,  Avoid taco shells, croutons or "confetti" and no "candied nuts" but regular nuts OK.   No pretzels, nabs  or chips.  Pickles and miniature sweet peppers are a good low carb alternative that provide a "crunch"  The bread is the only source of carbohydrate in a sandwich and  can be decreased by trying some of the attached alternatives to traditional loaf bread   Avoid "Low fat dressings, as well as Reyne Dumas and [employer] dressings They are loaded with sugar!   3 PM/ Mid day  Snack:  Consider  1 ounce of  almonds, walnuts, pistachios, pecans, peanuts,  Macadamia nuts or a nut medley.  Avoid "granola and granola bars "  Mixed nuts are ok in moderation as long as there are no raisins,  cranberries or dried fruit.   KIND bars are OK if you get the low glycemic index variety   Try the prosciutto/mozzarella cheese sticks by F[employer]ci  In deli /backery section   High protein      6 PM  Dinner:     Meat/fowl/fish with a green salad, and either broccoli, cauliflower, green beans, spinach, brussel sprouts or  Lima beans. DO NOT BREAD THE PROTEIN!!      There is a low carb pasta by Dreamfield's that is acceptable and tastes great: only 5 digestible carbs/serving.( All grocery stores but BJs carry it ) Several ready made meals are available low carb:   Try Michel Angelo's chicken piccata or chicken or eggplant parm over low carb pasta.(Lowes and BJs)   Clifton Custard Sanchez's "Carnitas" (pulled pork, no sauce,  0 carbs) or his beef pot roast to make a dinner burrito (at [employer]J's)  Pesto over low carb pasta (bj's sells a good quality pesto in the center refrigerated section of the deli   Try satueeing  Roosvelt Harps with mushroooms as a good side   [employer] Giant makes a mashed cauliflower that tastes like mashed potatoes  Whole wheat pasta is still full of digestible carbs and  Not as low in glycemic index as Dreamfield's.   Brown rice is still rice,  So skip the rice and noodles if you eat [place] or [place] (or at least limit to 1/2 cup)  9 PM snack :   Breyer's "low carb" fudgsicle or  ice cream bar (Carb Smart line), or  Weight Watcher's ice cream bar , or another "no sugar added" ice cream;  a serving of fresh berries/cherries with whipped cream   Cheese or DANNON'S LlGHT N FIT GREEK YOGURT  8 ounces of [place] unsweetened almond/cococunut milk    Treat yourself to a parfait made with whipped cream blueberiies, walnuts and  vanilla greek yogurt  Avoid bananas, pineapple, grapes  and watermelon on a regular basis because they are high in sugar.  THINK OF THEM AS DESSERT  Remember that snack Substitutions should be less than 10 NET carbs per serving and meals < 20 carbs. Remember to subtract fiber grams to get the "net carbs."

## 2023-06-02 NOTE — Progress Notes (Signed)
 Assessment & Plan:  Type 2 diabetes mellitus with hyperglycemia, without long-term current use of insulin (HCC) Assessment & Plan: Chronic , uncontrolled.  Patient has been off Mounjaro for period of time.  Discussed previous nausea with medication.  Discussed mechanism of action, titration, blackbox warning as a relates to medullary thyroid cancer, multiple endocrine neoplasia.  We jointly agreed to restart Mounjaro at 2.5 mg and if nausea is persistent we will discontinue medication to discuss alternative.  Provided Zofran to be used on rare occasion as I advised persistent nausea would be a reason to discontinue Mounjaro.  Orders: -     POCT glycosylated hemoglobin (Hb A1C) -     CBC with Differential/Platelet -     Comprehensive metabolic panel -     VITAMIN D 25 Hydroxy (Vit-D Deficiency, Fractures) -     Microalbumin / creatinine urine ratio  Essential hypertension Assessment & Plan: Chronic, stable.  Continue hydrochlorothiazide 12.5 mg daily, losartan 100 mg daily  Orders: -     TSH  Nausea -     Ondansetron HCl; Take 1 tablet (4 mg total) by mouth daily as needed for nausea or vomiting.  Dispense: 30 tablet; Refill: 1  Other orders -     Tirzepatide; Inject 2.5 mg into the skin once a week.  Dispense: 2 mL; Refill: 3     Return precautions given.   Risks, benefits, and alternatives of the medications and treatment plan prescribed today were discussed, and patient expressed understanding.   Education regarding symptom management and diagnosis given to patient on AVS either electronically or printed.  Return in about 6 weeks (around 07/14/2023) for Complete Physical Exam.  Rennie Plowman, FNP  Subjective:    Patient ID: [name], female    DOB: 1965/10/07, 58 y.o.   MRN: [id number]  CC: [name] is a 58 y.o. female who presents today to establish care.    HPI: She has not had a PCP for one year.   She been on Mounjaro 10 mg with nausea.  Otherwise lower  doses of medication have been tolerated.    No personal or family h/o MEN or thyroid cancer.   Compliant with hydrochlorothiazide 12.5 mg daily, losartan 100 mg daily  Allergies: Patient has no known allergies. Current Outpatient Medications on File Prior to Visit  Medication Sig Dispense Refill   clotrimazole-betamethasone (LOTRISONE) cream Apply 1 application topically 2 (two) times daily. 45 g 2   escitalopram (LEXAPRO) 10 MG tablet TAKE ONE TABLET BY MOUTH TWICE DAILY 180 tablet 1   fluconazole (DIFLUCAN) 150 MG tablet TAKE ONE TABLET MAY REPEAT IN 3 DAYS IF SYMPTOMS PERSIST 3 tablet 2   hydrochlorothiazide (HYDRODIURIL) 12.5 MG tablet Take 1 tablet (12.5 mg total) by mouth daily. 90 tablet 3   lidocaine-prilocaine (EMLA) cream Apply 1 Application topically as needed. 30 g 1   losartan (COZAAR) 100 MG tablet TAKE ONE TABLET (100 MG) BY MOUTH EVERY DAY 90 tablet 3   Accu-Chek Softclix Lancets lancets Use as instructed one a day e11.65 100 each 1   albuterol (VENTOLIN HFA) 108 (90 Base) MCG/ACT inhaler Inhale into the lungs.     No current facility-administered medications on file prior to visit.    Review of Systems  Constitutional:  Negative for chills and fever.  Respiratory:  Negative for cough.   Cardiovascular:  Negative for chest pain and palpitations.  Gastrointestinal:  Negative for nausea and vomiting.      Objective:  BP 136/80   Pulse 73   Temp 98.3 F (36.8 C) (Oral)   Ht 5\' 5"  (1.651 m)   Wt 226 lb (102.5 kg)   LMP 02/28/2020 (Exact Date)   SpO2 97%   BMI 37.61 kg/m  BP Readings from Last 3 Encounters:  06/02/23 136/80  06/23/22 125/73  11/25/21 127/73   Wt Readings from Last 3 Encounters:  06/02/23 226 lb (102.5 kg)  11/01/22 200 lb (90.7 kg)  06/23/22 213 lb 12.8 oz (97 kg)    Physical Exam Vitals reviewed.  Constitutional:      Appearance: She is well-developed.  Eyes:     Conjunctiva/sclera: Conjunctivae normal.  Cardiovascular:      Rate and Rhythm: Normal rate and regular rhythm.     Pulses: Normal pulses.     Heart sounds: Normal heart sounds.  Pulmonary:     Effort: Pulmonary effort is normal.     Breath sounds: Normal breath sounds. No wheezing, rhonchi or rales.  Skin:    General: Skin is warm and dry.  Neurological:     Mental Status: She is alert.  Psychiatric:        Speech: Speech normal.        Behavior: Behavior normal.        Thought Content: Thought content normal.

## 2023-06-03 LAB — CBC WITH DIFFERENTIAL/PLATELET
Basophils Absolute: 0.1 10*3/uL (ref 0.0–0.1)
Basophils Relative: 0.7 % (ref 0.0–3.0)
Eosinophils Absolute: 0.2 10*3/uL (ref 0.0–0.7)
Eosinophils Relative: 2.4 % (ref 0.0–5.0)
HCT: 44.5 % (ref 36.0–46.0)
Hemoglobin: 14.9 g/dL (ref 12.0–15.0)
Lymphocytes Relative: 32.1 % (ref 12.0–46.0)
Lymphs Abs: 2.5 10*3/uL (ref 0.7–4.0)
MCHC: 33.4 g/dL (ref 30.0–36.0)
MCV: 93.5 fl (ref 78.0–100.0)
Monocytes Absolute: 0.4 10*3/uL (ref 0.1–1.0)
Monocytes Relative: 5.4 % (ref 3.0–12.0)
Neutro Abs: 4.6 10*3/uL (ref 1.4–7.7)
Neutrophils Relative %: 59.4 % (ref 43.0–77.0)
Platelets: 169 10*3/uL (ref 150.0–400.0)
RBC: 4.76 Mil/uL (ref 3.87–5.11)
RDW: 12.6 % (ref 11.5–15.5)
WBC: 7.7 10*3/uL (ref 4.0–10.5)

## 2023-06-03 LAB — COMPREHENSIVE METABOLIC PANEL
ALT: 54 U/L — ABNORMAL HIGH (ref 0–35)
AST: 41 U/L — ABNORMAL HIGH (ref 0–37)
Albumin: 3.9 g/dL (ref 3.5–5.2)
Alkaline Phosphatase: 123 U/L — ABNORMAL HIGH (ref 39–117)
BUN: 12 mg/dL (ref 6–23)
CO2: 26 meq/L (ref 19–32)
Calcium: 8.4 mg/dL (ref 8.4–10.5)
Chloride: 98 meq/L (ref 96–112)
Creatinine, Ser: 0.75 mg/dL (ref 0.40–1.20)
GFR: 87.9 mL/min (ref >60.00)
Glucose, Bld: 405 mg/dL — ABNORMAL HIGH (ref 70–99)
Potassium: 3.4 meq/L — ABNORMAL LOW (ref 3.5–5.1)
Sodium: 133 meq/L — ABNORMAL LOW (ref 135–145)
Total Bilirubin: 0.6 mg/dL (ref 0.2–1.2)
Total Protein: 7.2 g/dL (ref 6.0–8.3)

## 2023-06-03 LAB — MICROALBUMIN / CREATININE URINE RATIO
Creatinine,U: 33 mg/dL
Microalb Creat Ratio: 21.2 mg/g (ref 0.0–30.0)
Microalb, Ur: <0.7 mg/dL (ref 0.0–1.9)

## 2023-06-03 LAB — TSH: TSH: 1.25 u[IU]/mL (ref 0.35–5.50)

## 2023-06-03 LAB — VITAMIN D 25 HYDROXY (VIT D DEFICIENCY, FRACTURES): VITD: 35.91 ng/mL (ref 30.00–100.00)

## 2023-06-05 ENCOUNTER — Other Ambulatory Visit: Payer: Self-pay | Admitting: Family

## 2023-06-05 ENCOUNTER — Encounter: Payer: Self-pay | Admitting: Family

## 2023-06-05 DIAGNOSIS — R748 Abnormal levels of other serum enzymes: Secondary | ICD-10-CM

## 2023-06-05 DIAGNOSIS — I10 Essential (primary) hypertension: Secondary | ICD-10-CM

## 2023-06-05 DIAGNOSIS — E1165 Type 2 diabetes mellitus with hyperglycemia: Secondary | ICD-10-CM

## 2023-06-05 MED ORDER — POTASSIUM CHLORIDE CRYS ER 10 MEQ PO TBCR: 10.0000 meq | EXTENDED_RELEASE_TABLET | Freq: Every day | ORAL | 3 refills | Status: AC

## 2023-06-06 ENCOUNTER — Telehealth: Payer: Self-pay | Admitting: Family

## 2023-06-06 ENCOUNTER — Other Ambulatory Visit: Payer: Self-pay

## 2023-06-06 MED ORDER — ACCU-CHEK GUIDE TEST VI STRP
ORAL_STRIP | 12 refills | Status: AC
Start: 2023-06-06 — End: ?

## 2023-06-06 NOTE — Telephone Encounter (Signed)
 Sent rx into the pharmacy and scheduled lab appt per results

## 2023-06-06 NOTE — Telephone Encounter (Signed)
 Lft pt vm to call ofc to sch [place]. thanks ?

## 2023-06-07 ENCOUNTER — Telehealth: Payer: Self-pay | Admitting: Family

## 2023-06-07 NOTE — Telephone Encounter (Signed)
 Lft pt vm to call ofc to sch [place]. thanks ?

## 2023-06-09 NOTE — Assessment & Plan Note (Signed)
 Chronic , uncontrolled.  Patient has been off Mounjaro for period of time.  Discussed previous nausea with medication.  Discussed mechanism of action, titration, blackbox warning as a relates to medullary thyroid cancer, multiple endocrine neoplasia.  We jointly agreed to restart Mounjaro at 2.5 mg and if nausea is persistent we will discontinue medication to discuss alternative.  Provided Zofran to be used on rare occasion as I advised persistent nausea would be a reason to discontinue Mounjaro.

## 2023-06-09 NOTE — Assessment & Plan Note (Signed)
Chronic, stable.  Continue hydrochlorothiazide 12.5 mg daily,losartan 100 mg daily

## 2023-06-10 ENCOUNTER — Ambulatory Visit
Admission: RE | Admit: 2023-06-10 | Discharge: 2023-06-10 | Disposition: A | Discharge status: Home / Self Care | Source: Ambulatory Visit | Attending: Family | Admitting: Family

## 2023-06-10 DIAGNOSIS — R748 Abnormal levels of other serum enzymes: Secondary | ICD-10-CM | POA: Diagnosis present

## 2023-06-21 ENCOUNTER — Other Ambulatory Visit (INDEPENDENT_AMBULATORY_CARE_PROVIDER_SITE_OTHER)

## 2023-06-21 DIAGNOSIS — E1165 Type 2 diabetes mellitus with hyperglycemia: Secondary | ICD-10-CM | POA: Diagnosis not present

## 2023-06-21 DIAGNOSIS — I10 Essential (primary) hypertension: Secondary | ICD-10-CM | POA: Diagnosis not present

## 2023-06-21 DIAGNOSIS — R748 Abnormal levels of other serum enzymes: Secondary | ICD-10-CM | POA: Diagnosis not present

## 2023-06-21 LAB — BASIC METABOLIC PANEL
BUN: 9 mg/dL (ref 6–23)
CO2: 26 meq/L (ref 19–32)
Calcium: 8.6 mg/dL (ref 8.4–10.5)
Chloride: 103 meq/L (ref 96–112)
Creatinine, Ser: 0.68 mg/dL (ref 0.40–1.20)
GFR: 96.12 mL/min (ref >60.00)
Glucose, Bld: 136 mg/dL — ABNORMAL HIGH (ref 70–99)
Potassium: 3.6 meq/L (ref 3.5–5.1)
Sodium: 138 meq/L (ref 135–145)

## 2023-06-21 LAB — LIPID PANEL
Cholesterol: 174 mg/dL (ref 0–200)
HDL: 38.5 mg/dL — ABNORMAL LOW (ref >39.00)
LDL Cholesterol: 115 mg/dL — ABNORMAL HIGH (ref 0–99)
NonHDL: 135.14
Total CHOL/HDL Ratio: 5
Triglycerides: 100 mg/dL (ref 0.0–149.0)
VLDL: 20 mg/dL (ref 0.0–40.0)

## 2023-06-21 LAB — PROTIME-INR
INR: 1.1 ratio — ABNORMAL HIGH (ref 0.8–1.0)
Prothrombin Time: 12 s (ref 9.6–13.1)

## 2023-06-21 LAB — CK: Total CK: 15 U/L (ref 7–177)

## 2023-06-24 LAB — ACUTE HEP PANEL AND HEP B SURFACE AB
HEPATITIS C ANTIBODY REFILL$(REFL): NONREACTIVE
Hep A IgM: REACTIVE — AB
Hep B C IgM: NONREACTIVE
Hepatitis B Surface Ag: NONREACTIVE

## 2023-06-24 LAB — CELIAC DISEASE PANEL
(tTG) Ab, IgA: <1 U/mL
(tTG) Ab, IgG: <1 U/mL
Gliadin IgA: <1 U/mL
Gliadin IgG: <1 U/mL
Immunoglobulin A: 424 mg/dL — ABNORMAL HIGH (ref 47–310)

## 2023-06-24 LAB — ANTI-SMOOTH MUSCLE ANTIBODY, IGG: Actin (Smooth Muscle) Antibody (IGG): <20 U (ref <20)

## 2023-06-24 LAB — IRON,TIBC AND FERRITIN PANEL
%SAT: 33 % (ref 16–45)
Ferritin: 108 ng/mL (ref 16–232)
Iron: 100 ug/dL (ref 45–160)
TIBC: 301 ug/dL (ref 250–450)

## 2023-06-24 LAB — REFLEX TIQ

## 2023-06-24 LAB — HEPATITIS A ANTIBODY, TOTAL: Hepatitis A AB,Total: REACTIVE — AB

## 2023-06-24 LAB — CERULOPLASMIN: Ceruloplasmin: 24 mg/dL (ref 14–48)

## 2023-06-24 LAB — MITOCHONDRIAL ANTIBODIES: Mitochondrial M2 Ab, IgG: <=20 U (ref <=20.0)

## 2023-06-26 ENCOUNTER — Encounter: Payer: Self-pay | Admitting: Family

## 2023-06-26 ENCOUNTER — Other Ambulatory Visit: Payer: Self-pay | Admitting: Family

## 2023-06-26 DIAGNOSIS — R899 Unspecified abnormal finding in specimens from other organs, systems and tissues: Secondary | ICD-10-CM

## 2023-06-27 LAB — ALKALINE PHOSPHATASE, ISOENZYMES
Alkaline Phosphatase: 157 IU/L — ABNORMAL HIGH (ref 44–121)
BONE FRACTION: 49 % (ref 14–68)
INTESTINAL FRAC.: 0 % (ref 0–18)
LIVER FRACTION: 51 % (ref 18–85)

## 2023-06-27 LAB — IMMUNOGLOBULINS A/E/G/M, SERUM
IgA/Immunoglobulin A, Serum: 475 mg/dL — ABNORMAL HIGH (ref 87–352)
IgE (Immunoglobulin E), Serum: 12 [IU]/mL (ref 6–495)
IgG (Immunoglobin G), Serum: 1381 mg/dL (ref 586–1602)
IgM (Immunoglobulin M), Srm: 208 mg/dL (ref 26–217)

## 2023-06-27 LAB — ANA W/REFLEX IF POSITIVE: Anti Nuclear Antibody (ANA): NEGATIVE

## 2023-07-06 ENCOUNTER — Other Ambulatory Visit (INDEPENDENT_AMBULATORY_CARE_PROVIDER_SITE_OTHER)

## 2023-07-06 DIAGNOSIS — R899 Unspecified abnormal finding in specimens from other organs, systems and tissues: Secondary | ICD-10-CM

## 2023-07-08 LAB — HEPATITIS A ANTIBODY, IGM: Hep A IgM: BORDERLINE — AB

## 2023-07-08 LAB — HEPATITIS B SURFACE ANTIBODY, QUANTITATIVE: Hep B S AB Quant (Post): <5 m[IU]/mL — ABNORMAL LOW (ref >=10)

## 2023-07-12 ENCOUNTER — Telehealth: Payer: Self-pay

## 2023-07-12 NOTE — Telephone Encounter (Signed)
 Copied from CRM [id number]. Topic: Referral - Question >> Jul 12, 2023  3:30 PM Sim Boast F wrote: Reason for CRM: Patient requested a call back regarding the gastroenterology referral, didn't specify said its a lot to explain. Please call her at [phone] (M)

## 2023-07-12 NOTE — Telephone Encounter (Signed)
 Spoke to pt questions were answered regarding GI

## 2023-07-15 ENCOUNTER — Encounter: Payer: Self-pay | Admitting: Nurse Practitioner

## 2023-07-26 ENCOUNTER — Encounter: Payer: Self-pay | Admitting: Family

## 2023-07-26 ENCOUNTER — Other Ambulatory Visit (HOSPITAL_COMMUNITY)
Admission: RE | Admit: 2023-07-26 | Discharge: 2023-07-26 | Disposition: A | Discharge status: Home / Self Care | Source: Ambulatory Visit | Attending: Family | Admitting: Family

## 2023-07-26 ENCOUNTER — Ambulatory Visit (INDEPENDENT_AMBULATORY_CARE_PROVIDER_SITE_OTHER): Payer: No Typology Code available for payment source | Admitting: Family

## 2023-07-26 VITALS — BP 136/80 | HR 78 | Temp 97.4°F | Ht 65.0 in | Wt 226.4 lb

## 2023-07-26 DIAGNOSIS — N952 Postmenopausal atrophic vaginitis: Secondary | ICD-10-CM

## 2023-07-26 DIAGNOSIS — K760 Fatty (change of) liver, not elsewhere classified: Secondary | ICD-10-CM

## 2023-07-26 DIAGNOSIS — Z0000 Encounter for general adult medical examination without abnormal findings: Secondary | ICD-10-CM

## 2023-07-26 DIAGNOSIS — Z0001 Encounter for general adult medical examination with abnormal findings: Secondary | ICD-10-CM | POA: Diagnosis not present

## 2023-07-26 DIAGNOSIS — E1165 Type 2 diabetes mellitus with hyperglycemia: Secondary | ICD-10-CM

## 2023-07-26 DIAGNOSIS — R899 Unspecified abnormal finding in specimens from other organs, systems and tissues: Secondary | ICD-10-CM

## 2023-07-26 DIAGNOSIS — Z1211 Encounter for screening for malignant neoplasm of colon: Secondary | ICD-10-CM | POA: Diagnosis not present

## 2023-07-26 DIAGNOSIS — Z1231 Encounter for screening mammogram for malignant neoplasm of breast: Secondary | ICD-10-CM

## 2023-07-26 DIAGNOSIS — Z23 Encounter for immunization: Secondary | ICD-10-CM

## 2023-07-26 LAB — URINALYSIS, ROUTINE W REFLEX MICROSCOPIC
Bilirubin Urine: NEGATIVE
Hgb urine dipstick: NEGATIVE
Leukocytes,Ua: NEGATIVE
Nitrite: NEGATIVE
Specific Gravity, Urine: 1.015 (ref 1.000–1.030)
Urine Glucose: NEGATIVE
Urobilinogen, UA: 2 — AB (ref 0.0–1.0)
pH: 6.5 (ref 5.0–8.0)

## 2023-07-26 MED ORDER — TIRZEPATIDE 5 MG/0.5ML ~~LOC~~ SOAJ: 5.0000 mg | SUBCUTANEOUS | 3 refills | Status: DC

## 2023-07-26 MED ORDER — ESTRADIOL 0.1 MG/GM VA CREA: 1.0000 | TOPICAL_CREAM | VAGINAL | 3 refills | Status: DC

## 2023-07-26 MED ORDER — ROSUVASTATIN CALCIUM 5 MG PO TABS: 5.0000 mg | ORAL_TABLET | Freq: Every day | ORAL | 3 refills | Status: AC

## 2023-07-26 NOTE — Progress Notes (Signed)
 Assessment & Plan:  Annual physical exam Assessment & Plan: Deferred clinical breast exam due to patient preference.  Deferred pelvic exam the patient is no longer screening for cervical cancer.  Reviewed pathology after hysterectomy from 2021.  Cologuard ordered.  She will schedule her mammogram    Screening for colon cancer -     Cologuard  Vaginal atrophy Assessment & Plan: Presentation consistent with vaginal atrophy.  Trial of Estrace .  If symptoms persist, recommend pelvic exam.  Discussed black cohosh over-the-counter for hot flashes.  Orders: -     Urinalysis, Routine w reflex microscopic -     Urine Culture -     Cervicovaginal ancillary only -     Estradiol ; Place 1 Applicatorful vaginally 2 (two) times a week.  Dispense: 42.5 g; Refill: 3  Encounter for screening mammogram for malignant neoplasm of breast -     3D Screening Mammogram, Left and Right; Future  Type 2 diabetes mellitus with hyperglycemia, without long-term current use of insulin (HCC) Assessment & Plan:  Anticipate improved A1c based on fasting blood sugar.  Doing well on Mounjaro .  Continue 5 mg Mounjaro  qwk  Orders: -     Hepatic function panel -     Rosuvastatin  Calcium ; Take 1 tablet (5 mg total) by mouth daily.  Dispense: 90 tablet; Refill: 3 -     Lipid panel; Future -     Hepatic function panel; Future  Abnormal laboratory test -     Hepatitis A antibody, IgM; Future -     Hepatitis A antibody, total; Future  Hepatic steatosis -     Hepatic function panel -     Hepatitis A antibody, IgM; Future -     Hepatitis A antibody, total; Future  Need for shingles vaccine -     Varicella-zoster vaccine IM  Other orders -     Tirzepatide ; Inject 5 mg into the skin once a week.  Dispense: 6 mL; Refill: 3     Return precautions given.   Risks, benefits, and alternatives of the medications and treatment plan prescribed today were discussed, and patient expressed understanding.   Education  regarding symptom management and diagnosis given to patient on AVS either electronically or printed.  Return in about 2 months (around 09/25/2023).  Bascom Bossier, FNP  Subjective:    Patient ID: [name], female    DOB: 10-Mar-1966, 58 y.o.   MRN: [id number]  CC: [name] is a 58 y.o. female who presents today for physical exam and medication follow up.    HPI: Feels well today  Doing well on mounjaro .   No abdominal pain, jaundice, N, vomiting, fever, constipation.   FBG 136, 133, 130, 153    She complains of hot flashes. She feels sweaty in her genital area. Denies unusual weight loss.  She has some vaginal itching. Pain with intercourse due to dryness.    Elevated cholesterol   Pending GI consult for Persistent elevated alkaline phosphatase, Elevated Hep A IgM  Elevated IgG, question autoimmune hepatitis She is scheduled to see GI 09/09/23  Colorectal Cancer Screening: due Breast Cancer Screening: Mammogram due Cervical Cancer Screening: h/o hysterectomy d/t fibroids, menorrhagia. No GYN cancer. She has her ovaries. Pathology 03/31/20 pathology submitted Uterus, cervix, bilateral fallopian tubes   Pap smear  02/14/2020 negative malignancy, neg HPV Pap smear 08/23/2017 negative HPV, insufficient cellularity  Bone Health screening/DEXA for 65+: No increased fracture risk. Defer screening at this time.  Lung Cancer Screening:  Doesn't have 20 year pack year history and age > 57 years yo 71 years       Tetanus - UTD        Pneumococcal - Candidate for.   Exercise: Gets regular exercise.   Alcohol use:  weekly Smoking/tobacco use: Nonsmoker.    Health Maintenance  Topic Date Due   HIV Screening  Never done   Cologuard (Stool DNA test)  09/06/2022   COVID-19 Vaccine (1 - 2024-25 season) Never done   Pneumococcal Vaccination (1 of 2 - PCV) 07/25/2024*   Zoster (Shingles) Vaccine (2 of 2) 09/20/2023   Eye exam for diabetics  10/13/2023   Flu Shot  11/04/2023    Hemoglobin A1C  11/30/2023   Mammogram  01/07/2024   Yearly kidney health urinalysis for diabetes  06/01/2024   Yearly kidney function blood test for diabetes  06/20/2024   Complete foot exam   07/25/2024   DTaP/Tdap/Td vaccine (2 - Td or Tdap) 06/22/2032   Hepatitis C Screening  Completed   HPV Vaccine  Aged Out   Meningitis B Vaccine  Aged Out  *Topic was postponed. The date shown is not the original due date.    ALLERGIES: Patient has no known allergies.  Current Outpatient Medications on File Prior to Visit  Medication Sig Dispense Refill   clotrimazole -betamethasone  (LOTRISONE ) cream Apply 1 application topically 2 (two) times daily. 45 g 2   escitalopram  (LEXAPRO ) 10 MG tablet TAKE ONE TABLET BY MOUTH TWICE DAILY 180 tablet 1   fluconazole  (DIFLUCAN ) 150 MG tablet TAKE ONE TABLET MAY REPEAT IN 3 DAYS IF SYMPTOMS PERSIST 3 tablet 2   glucose blood (ACCU-CHEK GUIDE TEST) test strip Use as instructed 100 each 12   hydrochlorothiazide  (HYDRODIURIL ) 12.5 MG tablet Take 1 tablet (12.5 mg total) by mouth daily. 90 tablet 3   losartan  (COZAAR ) 100 MG tablet TAKE ONE TABLET (100 MG) BY MOUTH EVERY DAY 90 tablet 3   ondansetron  (ZOFRAN ) 4 MG tablet Take 1 tablet (4 mg total) by mouth daily as needed for nausea or vomiting. 30 tablet 1   potassium chloride  (KLOR-CON  M) 10 MEQ tablet Take 1 tablet (10 mEq total) by mouth daily. 90 tablet 3   Accu-Chek Softclix Lancets lancets Use as instructed one a day e11.65 100 each 1   albuterol  (VENTOLIN  HFA) 108 (90 Base) MCG/ACT inhaler Inhale into the lungs.     lidocaine -prilocaine  (EMLA ) cream Apply 1 Application topically as needed. (Patient not taking: Reported on 07/26/2023) 30 g 1   No current facility-administered medications on file prior to visit.    Review of Systems  Constitutional:  Positive for diaphoresis. Negative for chills, fever and unexpected weight change.  HENT:  Negative for congestion.   Respiratory:  Negative for cough.    Cardiovascular:  Negative for chest pain, palpitations and leg swelling.  Gastrointestinal:  Negative for nausea and vomiting.  Musculoskeletal:  Negative for arthralgias and myalgias.  Skin:  Negative for rash.  Neurological:  Negative for headaches.  Hematological:  Negative for adenopathy.  Psychiatric/Behavioral:  Negative for confusion.       Objective:    BP 136/80   Pulse 78   Temp (!) 97.4 F (36.3 C) (Oral)   Ht 5\' 5"  (1.651 m)   Wt 226 lb 6.4 oz (102.7 kg)   LMP 02/28/2020 (Exact Date)   SpO2 95%   BMI 37.67 kg/m   BP Readings from Last 3 Encounters:  07/26/23 136/80  06/02/23 136/80  06/23/22  125/73   Wt Readings from Last 3 Encounters:  07/26/23 226 lb 6.4 oz (102.7 kg)  06/02/23 226 lb (102.5 kg)  11/01/22 200 lb (90.7 kg)    Physical Exam Vitals reviewed.  Constitutional:      Appearance: Normal appearance. She is well-developed.  Eyes:     Conjunctiva/sclera: Conjunctivae normal.  Cardiovascular:     Rate and Rhythm: Normal rate and regular rhythm.     Pulses: Normal pulses.     Heart sounds: Normal heart sounds.  Pulmonary:     Effort: Pulmonary effort is normal.     Breath sounds: Normal breath sounds. No wheezing, rhonchi or rales.  Abdominal:     General: Bowel sounds are normal. There is no distension.     Palpations: Abdomen is soft. Abdomen is not rigid. There is no fluid wave or mass.     Tenderness: There is no abdominal tenderness. There is no guarding or rebound.  Skin:    General: Skin is warm and dry.  Neurological:     Mental Status: She is alert.  Psychiatric:        Speech: Speech normal.        Behavior: Behavior normal.        Thought Content: Thought content normal.

## 2023-07-26 NOTE — Patient Instructions (Addendum)
 Trial vaginal estrace    Try simple Black cohosh over the counter for hot flashes.    Please call  and schedule your 3D mammogram and /or bone density scan as we discussed.   [facility]  ( new location in 2023)  [address], [place], [place] [address]  [place], [place]  [phone]   Health Maintenance for Postmenopausal Women Menopause is a normal process in which your ability to get pregnant comes to an end. This process happens slowly over many months or years, usually between the ages of 68 and 59. Menopause is complete when you have missed your menstrual period for 12 months. It is important to talk with your health care provider about some of the most common conditions that affect women after menopause (postmenopausal women). These include heart disease, cancer, and bone loss (osteoporosis). Adopting a healthy lifestyle and getting preventive care can help to promote your health and wellness. The actions you take can also lower your chances of developing some of these common conditions. What are the signs and symptoms of menopause? During menopause, you may have the following symptoms: Hot flashes. These can be moderate or severe. Night sweats. Decrease in sex drive. Mood swings. Headaches. Tiredness (fatigue). Irritability. Memory problems. Problems falling asleep or staying asleep. Talk with your health care provider about treatment options for your symptoms. Do I need hormone replacement therapy? Hormone replacement therapy is effective in treating symptoms that are caused by menopause, such as hot flashes and night sweats. Hormone replacement carries certain risks, especially as you become older. If you are thinking about using estrogen or estrogen with progestin, discuss the benefits and risks with your health care provider. How can I reduce my risk for heart disease and stroke? The risk of heart disease, heart attack, and stroke increases as you  age. One of the causes may be a change in the body's hormones during menopause. This can affect how your body uses dietary fats, triglycerides, and cholesterol. Heart attack and stroke are medical emergencies. There are many things that you can do to help prevent heart disease and stroke. Watch your blood pressure High blood pressure causes heart disease and increases the risk of stroke. This is more likely to develop in people who have high blood pressure readings or are overweight. Have your blood pressure checked: Every 3-5 years if you are 60-65 years of age. Every year if you are 17 years old or older. Eat a healthy diet  Eat a diet that includes plenty of vegetables, fruits, low-fat dairy products, and lean protein. Do not eat a lot of foods that are high in solid fats, added sugars, or sodium. Get regular exercise Get regular exercise. This is one of the most important things you can do for your health. Most adults should: Try to exercise for at least 150 minutes each week. The exercise should increase your heart rate and make you sweat (moderate-intensity exercise). Try to do strengthening exercises at least twice each week. Do these in addition to the moderate-intensity exercise. Spend less time sitting. Even light physical activity can be beneficial. Other tips Work with your health care provider to achieve or maintain a healthy weight. Do not use any products that contain nicotine or tobacco. These products include cigarettes, chewing tobacco, and vaping devices, such as e-cigarettes. If you need help quitting, ask your health care provider. Know your numbers. Ask your health care provider to check your cholesterol and your blood sugar (glucose). Continue to  have your blood tested as directed by your health care provider. Do I need screening for cancer? Depending on your health history and family history, you may need to have cancer screenings at different stages of your life. This may  include screening for: Breast cancer. Cervical cancer. Lung cancer. Colorectal cancer. What is my risk for osteoporosis? After menopause, you may be at increased risk for osteoporosis. Osteoporosis is a condition in which bone destruction happens more quickly than new bone creation. To help prevent osteoporosis or the bone fractures that can happen because of osteoporosis, you may take the following actions: If you are 12-47 years old, get at least 1,000 mg of calcium  and at least 600 international units (IU) of vitamin D  per day. If you are older than age 30 but younger than age 62, get at least 1,200 mg of calcium  and at least 600 international units (IU) of vitamin D  per day. If you are older than age 39, get at least 1,200 mg of calcium  and at least 800 international units (IU) of vitamin D  per day. Smoking and drinking excessive alcohol increase the risk of osteoporosis. Eat foods that are rich in calcium  and vitamin D , and do weight-bearing exercises several times each week as directed by your health care provider. How does menopause affect my mental health? Depression may occur at any age, but it is more common as you become older. Common symptoms of depression include: Feeling depressed. Changes in sleep patterns. Changes in appetite or eating patterns. Feeling an overall lack of motivation or enjoyment of activities that you previously enjoyed. Frequent crying spells. Talk with your health care provider if you think that you are experiencing any of these symptoms. General instructions See your health care provider for regular wellness exams and vaccines. This may include: Scheduling regular health, dental, and eye exams. Getting and maintaining your vaccines. These include: Influenza vaccine. Get this vaccine each year before the flu season begins. Pneumonia vaccine. Shingles vaccine. Tetanus, diphtheria, and pertussis (Tdap) booster vaccine. Your health care provider may also  recommend other immunizations. Tell your health care provider if you have ever been abused or do not feel safe at home. Summary Menopause is a normal process in which your ability to get pregnant comes to an end. This condition causes hot flashes, night sweats, decreased interest in sex, mood swings, headaches, or lack of sleep. Treatment for this condition may include hormone replacement therapy. Take actions to keep yourself healthy, including exercising regularly, eating a healthy diet, watching your weight, and checking your blood pressure and blood sugar levels. Get screened for cancer and depression. Make sure that you are up to date with all your vaccines. This information is not intended to replace advice given to you by your health care provider. Make sure you discuss any questions you have with your health care provider. Document Revised: 08/11/2020 Document Reviewed: 08/11/2020 Elsevier Patient Education  2024 [employer].

## 2023-07-27 LAB — CERVICOVAGINAL ANCILLARY ONLY
Bacterial Vaginitis (gardnerella): NEGATIVE
Candida Glabrata: NEGATIVE
Candida Vaginitis: NEGATIVE
Chlamydia: NEGATIVE
Comment: NEGATIVE
Comment: NEGATIVE
Comment: NEGATIVE
Comment: NEGATIVE
Comment: NEGATIVE
Comment: NORMAL
Neisseria Gonorrhea: NEGATIVE
Trichomonas: NEGATIVE

## 2023-07-27 LAB — URINE CULTURE
MICRO NUMBER:: 16359488
Result:: NO GROWTH
SPECIMEN QUALITY:: ADEQUATE

## 2023-07-28 ENCOUNTER — Encounter: Payer: Self-pay | Admitting: Family

## 2023-07-30 ENCOUNTER — Encounter: Payer: Self-pay | Admitting: Family

## 2023-07-30 NOTE — Progress Notes (Signed)
 Sch hep a antibody labs; labs are ordered

## 2023-08-03 ENCOUNTER — Encounter: Payer: Self-pay | Admitting: Family

## 2023-08-03 NOTE — Assessment & Plan Note (Addendum)
 Deferred clinical breast exam due to patient preference.  Deferred pelvic exam the patient is no longer screening for cervical cancer.  Reviewed pathology after hysterectomy from 2021.  Cologuard ordered.  She will schedule her mammogram

## 2023-08-03 NOTE — Assessment & Plan Note (Signed)
  Anticipate improved A1c based on fasting blood sugar.  Doing well on Mounjaro .  Continue 5 mg Mounjaro  qwk

## 2023-08-03 NOTE — Assessment & Plan Note (Signed)
 Presentation consistent with vaginal atrophy.  Trial of Estrace .  If symptoms persist, recommend pelvic exam.  Discussed black cohosh over-the-counter for hot flashes.

## 2023-08-19 ENCOUNTER — Other Ambulatory Visit

## 2023-08-19 DIAGNOSIS — E1165 Type 2 diabetes mellitus with hyperglycemia: Secondary | ICD-10-CM | POA: Diagnosis not present

## 2023-08-19 DIAGNOSIS — R899 Unspecified abnormal finding in specimens from other organs, systems and tissues: Secondary | ICD-10-CM

## 2023-08-19 DIAGNOSIS — K760 Fatty (change of) liver, not elsewhere classified: Secondary | ICD-10-CM

## 2023-08-19 LAB — LIPID PANEL
Cholesterol: 139 mg/dL (ref 0–200)
HDL: 38.3 mg/dL — ABNORMAL LOW (ref >39.00)
LDL Cholesterol: 84 mg/dL (ref 0–99)
NonHDL: 100.33
Total CHOL/HDL Ratio: 4
Triglycerides: 83 mg/dL (ref 0.0–149.0)
VLDL: 16.6 mg/dL (ref 0.0–40.0)

## 2023-08-19 LAB — HEPATIC FUNCTION PANEL
ALT: 50 U/L — ABNORMAL HIGH (ref 0–35)
AST: 47 U/L — ABNORMAL HIGH (ref 0–37)
Albumin: 3.8 g/dL (ref 3.5–5.2)
Alkaline Phosphatase: 107 U/L (ref 39–117)
Bilirubin, Direct: 0.1 mg/dL (ref 0.0–0.3)
Total Bilirubin: 0.6 mg/dL (ref 0.2–1.2)
Total Protein: 6.9 g/dL (ref 6.0–8.3)

## 2023-08-20 LAB — HEPATITIS A ANTIBODY, TOTAL: Hepatitis A AB,Total: NONREACTIVE

## 2023-08-20 LAB — HEPATITIS A ANTIBODY, IGM: Hep A IgM: BORDERLINE — AB

## 2023-08-26 ENCOUNTER — Other Ambulatory Visit: Payer: Self-pay | Admitting: Nurse Practitioner

## 2023-08-26 DIAGNOSIS — B379 Candidiasis, unspecified: Secondary | ICD-10-CM

## 2023-08-31 LAB — COLOGUARD: COLOGUARD: NEGATIVE

## 2023-09-01 ENCOUNTER — Ambulatory Visit: Payer: Self-pay | Admitting: Family

## 2023-09-09 ENCOUNTER — Encounter: Payer: Self-pay | Admitting: Nurse Practitioner

## 2023-09-09 ENCOUNTER — Ambulatory Visit: Admitting: Nurse Practitioner

## 2023-09-09 ENCOUNTER — Other Ambulatory Visit

## 2023-09-09 ENCOUNTER — Ambulatory Visit: Payer: Self-pay | Admitting: Nurse Practitioner

## 2023-09-09 ENCOUNTER — Other Ambulatory Visit (INDEPENDENT_AMBULATORY_CARE_PROVIDER_SITE_OTHER)

## 2023-09-09 VITALS — BP 130/78 | HR 80 | Ht 65.0 in | Wt 226.0 lb

## 2023-09-09 DIAGNOSIS — K8020 Calculus of gallbladder without cholecystitis without obstruction: Secondary | ICD-10-CM | POA: Diagnosis not present

## 2023-09-09 DIAGNOSIS — K760 Fatty (change of) liver, not elsewhere classified: Secondary | ICD-10-CM

## 2023-09-09 DIAGNOSIS — R7989 Other specified abnormal findings of blood chemistry: Secondary | ICD-10-CM | POA: Diagnosis not present

## 2023-09-09 LAB — HEPATIC FUNCTION PANEL
ALT: 39 U/L — ABNORMAL HIGH (ref 0–35)
AST: 42 U/L — ABNORMAL HIGH (ref 0–37)
Albumin: 3.7 g/dL (ref 3.5–5.2)
Alkaline Phosphatase: 99 U/L (ref 39–117)
Bilirubin, Direct: 0.1 mg/dL (ref 0.0–0.3)
Total Bilirubin: 0.5 mg/dL (ref 0.2–1.2)
Total Protein: 6.8 g/dL (ref 6.0–8.3)

## 2023-09-09 NOTE — Addendum Note (Signed)
 Addended by: Lanora Plana on: 09/09/2023 01:27 PM   Modules accepted: Orders

## 2023-09-09 NOTE — Patient Instructions (Signed)
 Exercise as tolerated to loses weight   Follow up in 2-3 months  Reduce alcohol intake  Reduce Carbohydrates in your diet (Reduce bread, pasta, rice, potatoes, sweets)  Your provider has requested that you go to the basement level for lab work before leaving today. Press "B" on the elevator. The lab is located at the first door on the left as you exit the elevator.  Please follow up sooner if symptoms increase or worsen  Due to recent changes in healthcare laws, you may see the results of your imaging and laboratory studies on MyChart before your provider has had a chance to review them.  We understand that in some cases there may be results that are confusing or concerning to you. Not all laboratory results come back in the same time frame and the provider may be waiting for multiple results in order to interpret others.  Please give us  48 hours in order for your provider to thoroughly review all the results before contacting the office for clarification of your results.   Thank you for trusting me with your gastrointestinal care!   Everett Hitt, NP _______________________________________________________  If your blood pressure at your visit was 140/90 or greater, please contact your primary care physician to follow up on this.  _______________________________________________________  If you are age 69 or older, your body mass index should be between 23-30. Your Body mass index is 37.61 kg/m. If this is out of the aforementioned range listed, please consider follow up with your Primary Care Provider.  If you are age 12 or younger, your body mass index should be between 19-25. Your Body mass index is 37.61 kg/m. If this is out of the aformentioned range listed, please consider follow up with your Primary Care Provider.   ________________________________________________________  The [facility] providers would like to encourage you to use MYCHART to communicate with providers  for non-urgent requests or questions.  Due to long hold times on the telephone, sending your provider a message by [facility] may be a faster and more efficient way to get a response.  Please allow 48 business hours for a response.  Please remember that this is for non-urgent requests.  _______________________________________________________

## 2023-09-09 NOTE — Progress Notes (Signed)
 09/09/2023 [name] [id number] 1965/11/25   CHIEF COMPLAINT: Gallstones, fatty liver  HISTORY OF PRESENT ILLNESS: [name] is a 58 year old female with a past medical history of depression, hypertension and diabetes mellitus type 2.  Past partial hysterectomy.  She presents to our office today as referred by Bascom Bossier NP for further evaluation regarding elevated LFTs and hepatic steatosis. She has a history of elevated LFTs since 2013. She underwent routine laboratory studies by her PCP 06/02/2023 which identified a total bilirubin level of 0.6.  Alk phos 123.  AST 41.  ALT 54.  She subsequently underwent further laboratory studies 06/21/2023 which showed hepatitis A IgM reactive.  Hepatitis B surface antigen nonreactive.  Hepatitis B core IgM nonreactive.  Hepatitis C antibody nonreactive.  ANA negative. SMA < 20. AMA < 20. Ceruloplasmin 24. IgA 424. TTG IgG < 1. Gliadin IgG < 1. Gliadin IgA < 1. CK 15. IgG 1,381. Iron 100.  Ferritin 108.  INR 1.1.  Cholesterol 174.  LDL 115.  HDL 38.50.  She was started on Crestor  5 mg once daily 07/2023.  Labs 07/06/2023 showed hepatitis B surface antibody < 5 and the patient was advised to undergo hepatitis B vaccination.  A RUQ sonogram 06/10/2023 showed cholelithiasis without evidence of acute cholecystitis or biliary duct dilatation and showed evidence of hepatic steatosis.  She has infrequent nausea without vomiting.  No upper or lower abdominal pain.  No GERD symptoms.  She is passing normal brown bowel movement daily.  No bloody or black stools.  No NSAID use.  She drinks 1 glass of wine with dinner 4 days weekly and on the weekends she drinks 2 or 3 glasses of wine or one or two shots of liquor.  No known family history of liver disease.  She underwent a Cologuard test 08/26/2023 which was negative.  No known family history of colon polyps or colorectal cancer.     Latest Ref Rng & Units 08/19/2023    9:27 AM 06/21/2023    9:43 AM 06/02/2023    2:51 PM   Hepatic Function  Total Protein 6.0 - 8.3 g/dL 6.9   7.2   Albumin 3.5 - 5.2 g/dL 3.8   3.9   AST 0 - 37 U/L 47   41   ALT 0 - 35 U/L 50   54   Alk Phosphatase 39 - 117 U/L 107  157  123   Total Bilirubin 0.2 - 1.2 mg/dL 0.6   0.6   Bilirubin, Direct 0.0 - 0.3 mg/dL 0.1       Cologuard 3/87/5643: Negative  RUQ sonogram 06/23/2023:  FINDINGS: Gallbladder:   Large stone in the gallbladder lumen. No gallbladder wall thickening or pericholecystic fluid. Negative sonographic Murphy's sign.   Common bile duct:   Diameter: 5 mm   Liver:   Increased echogenicity. No focal lesion. Portal vein is patent on color Doppler imaging with normal direction of blood flow towards the liver.   Other: None.   IMPRESSION: 1. Cholelithiasis without secondary signs of acute cholecystitis. 2. Increased hepatic parenchymal echogenicity suggestive of steatosis.  Past Medical History:  Diagnosis Date   Chest pain 2011   Depression    Diabetes mellitus, type 2 (HCC)    Fatty liver    HTN (hypertension)    Hypoglycemia    Hypokalemia    Plantar fasciitis of right foot    PONV (postoperative nausea and vomiting)    Past Surgical History:  Procedure Laterality Date  ABDOMINAL HYSTERECTOMY  Dec 2021   hysterectomy d/t fibroids, menorrhagia. No GYN cancer. She has her ovaries.   BREAST BIOPSY Left 2012   benign   CYSTOSCOPY W/ URETERAL STENT PLACEMENT Bilateral 03/31/2020   Procedure: CYSTOSCOPY WITH STENT REPLACEMENT;  Surgeon: Geraline Knapp, MD;  Location: [facility] OR[facility];  Service: Urology;  Laterality: Bilateral;   TOTAL LAPAROSCOPIC HYSTERECTOMY WITH SALPINGECTOMY Bilateral 03/31/2020   Procedure: TOTAL LAPAROSCOPIC HYSTERECTOMY WITH BILATERAL SALPINGECTOMY;  Surgeon: Ward, Margarie Shay, MD;  Location: [facility] OR[facility];  Service: Gynecology;  Laterality: Bilateral;   Social History: She is married.  She has 3 sons.  She is a Psychologist, sport and exercise.  Non-smoker.  She drinks one glass of wine with dinner 4  days weekly and on the weekends she drinks 2 or 3 glasses of wine or 1 or 2 shots of liquor. No drug use.  Family History:  She  reports that she has never smoked. She has never used smokeless tobacco. She reports current alcohol use of about 6.0 standard drinks of alcohol per week. She reports that she does not use drugs. family history includes Breast cancer (age of onset: 50) in her niece; Breast cancer (age of onset: 72) in her niece; Coronary artery disease (age of onset: 58) in her mother; Depression in her brother; Diabetes in her father, mother, and sister; Early death in her brother; Heart disease in her father and mother; Heart failure in her mother; Hypertension in her father and mother; Stroke in her father and mother.  No Known Allergies    Outpatient Encounter Medications as of 09/09/2023  Medication Sig   escitalopram  (LEXAPRO ) 10 MG tablet TAKE ONE TABLET BY MOUTH TWICE DAILY   glucose blood (ACCU-CHEK GUIDE TEST) test strip Use as instructed   hydrochlorothiazide  (HYDRODIURIL ) 12.5 MG tablet Take 1 tablet (12.5 mg total) by mouth daily.   losartan  (COZAAR ) 100 MG tablet TAKE ONE TABLET (100 MG) BY MOUTH EVERY DAY   ondansetron  (ZOFRAN ) 4 MG tablet Take 1 tablet (4 mg total) by mouth daily as needed for nausea or vomiting.   potassium chloride  (KLOR-CON  M) 10 MEQ tablet Take 1 tablet (10 mEq total) by mouth daily.   rosuvastatin  (CRESTOR ) 5 MG tablet Take 1 tablet (5 mg total) by mouth daily.   tirzepatide  (MOUNJARO ) 5 MG/0.5ML Pen Inject 5 mg into the skin once a week.   [DISCONTINUED] Accu-Chek Softclix Lancets lancets Use as instructed one a day e11.65   [DISCONTINUED] albuterol  (VENTOLIN  HFA) 108 (90 Base) MCG/ACT inhaler Inhale into the lungs.   [DISCONTINUED] clotrimazole -betamethasone  (LOTRISONE ) cream Apply 1 application topically 2 (two) times daily.   [DISCONTINUED] estradiol  (ESTRACE ) 0.1 MG/GM vaginal cream Place 1 Applicatorful vaginally 2 (two) times a week.    [DISCONTINUED] fluconazole  (DIFLUCAN ) 150 MG tablet TAKE ONE TABLET BY MOUTH ONCE FOR 1 DOSE. MAY REPEAT IN 3 DAYS IF SYMPTOMS PERSIST.   [DISCONTINUED] lidocaine -prilocaine  (EMLA ) cream Apply 1 Application topically as needed. (Patient not taking: Reported on 07/26/2023)   No facility-administered encounter medications on file as of 09/09/2023.   REVIEW OF SYSTEMS:  Gen: Denies fever, sweats or chills. No weight loss.  CV: Denies chest pain, palpitations or edema. Resp: Denies cough, shortness of breath of hemoptysis.  GI: See HPI GU: Denies urinary burning, blood in urine, increased urinary frequency or incontinence. MS: Denies joint pain, muscles aches or weakness. Derm: Denies rash, itchiness, skin lesions or unhealing ulcers. Psych: Denies depression, anxiety, memory loss or confusion. Heme: Denies bruising, easy bleeding. Neuro:  Denies headaches, dizziness or paresthesias. Endo:   + DM type II.  PHYSICAL EXAM: BP 130/78   Pulse 80   Ht 5\' 5"  (1.651 m)   Wt 226 lb (102.5 kg)   LMP 02/28/2020 (Exact Date)   BMI 37.61 kg/m  General: 58 year old female in no acute distress. Head: Normocephalic and atraumatic.  Facial rosacea. Eyes:  Sclerae non-icteric, conjunctive pink. Ears: Normal auditory acuity. Mouth: Dentition intact. No ulcers or lesions.  Neck: Supple, no lymphadenopathy or thyromegaly.  Lungs: Clear bilaterally to auscultation without wheezes, crackles or rhonchi. Heart: Regular rate and rhythm. No murmur, rub or gallop appreciated.  Abdomen: Soft, nontender, nondistended. No masses. No hepatosplenomegaly. Normoactive bowel sounds x 4 quadrants.  Rectal: Deferred. Musculoskeletal: Symmetrical with no gross deformities. Skin: Warm and dry. No rash or lesions on visible extremities. Extremities: No edema. Neurological: Alert oriented x 4, no focal deficits.  Psychological: Alert and cooperative. Normal mood and affect.  ASSESSMENT AND PLAN:  58 year old female with  chronically elevated LFTs since 2013.  RUQ sonogram showed evidence of gallstones without biliary ductal dilatation and hepatic steatosis. Hepatitis A IgM positive which indicates acute hepatitis within the past 6 months, patient denies any recent acute N/V/D illness. Hepatitis B surface antigen nonreactive. Hepatitis C antibody nonreactive. Negative workup for autoimmune liver disease. Normal ceruloplasmin level. Normal iron and ferritin levels. No known family history of liver disease.  - Hepatic panel, hepatitis B core total antibody - Patient is not immune to hepatitis B, recommend hepatitis B vaccination with PCP - Patient encouraged to reduce carbohydrates in diet i.e.: Reduce bread/pasta/potatoes/rice and sweets, exercise as tolerated and lose weight to reduce the risk of developing fatty liver disease - Reduce alcohol intake, I discussed with the patient in setting of routine alcohol use + hepatic steatosis she is at increased risk for liver disease  - Follow-up in 2 to 3 months, if LFTs remain elevated and if no weight loss achieved we will schedule patient for elastography - Patient declines referral to [facility] for weight loss management at this time - Okay to continue Crestor  5 mg daily for now  Colon cancer screening.  Negative Cologuard 08/2023.  No family history of colon polyps or colorectal cancer. - Recommend a repeat Cologuard 08/2026 versus a conventional colonoscopy  Elevated IgA level, follow-up with PCP, consider hematology or immunology consult   DM type II       CC:  Arnett, Hanley Lew, FNP

## 2023-09-10 LAB — IGG: IgG (Immunoglobin G), Serum: 1246 mg/dL (ref 600–1640)

## 2023-09-25 NOTE — Progress Notes (Signed)
 Agree with the assessment and plan as outlined by Alcide Evener, NP.    Zae Kirtz E. Tomasa Rand, MD [facility]

## 2023-09-27 ENCOUNTER — Encounter: Payer: Self-pay | Admitting: Family

## 2023-09-27 ENCOUNTER — Ambulatory Visit: Admitting: Family

## 2023-09-27 VITALS — BP 130/70 | HR 78 | Temp 97.8°F | Ht 65.0 in | Wt 224.2 lb

## 2023-09-27 DIAGNOSIS — Z23 Encounter for immunization: Secondary | ICD-10-CM | POA: Diagnosis not present

## 2023-09-27 DIAGNOSIS — I10 Essential (primary) hypertension: Secondary | ICD-10-CM

## 2023-09-27 DIAGNOSIS — E1165 Type 2 diabetes mellitus with hyperglycemia: Secondary | ICD-10-CM | POA: Diagnosis not present

## 2023-09-27 DIAGNOSIS — N952 Postmenopausal atrophic vaginitis: Secondary | ICD-10-CM

## 2023-09-27 DIAGNOSIS — L249 Irritant contact dermatitis, unspecified cause: Secondary | ICD-10-CM

## 2023-09-27 DIAGNOSIS — R519 Headache, unspecified: Secondary | ICD-10-CM

## 2023-09-27 DIAGNOSIS — K760 Fatty (change of) liver, not elsewhere classified: Secondary | ICD-10-CM

## 2023-09-27 DIAGNOSIS — R7309 Other abnormal glucose: Secondary | ICD-10-CM

## 2023-09-27 LAB — POCT GLYCOSYLATED HEMOGLOBIN (HGB A1C): Hemoglobin A1C: 6.2 % — AB (ref 4.0–5.6)

## 2023-09-27 MED ORDER — OZEMPIC (0.25 OR 0.5 MG/DOSE) 2 MG/1.5ML ~~LOC~~ SOPN: 0.2500 mg | PEN_INJECTOR | SUBCUTANEOUS | 3 refills | Status: DC

## 2023-09-27 MED ORDER — TRIAMCINOLONE ACETONIDE 0.5 % EX OINT: 1.0000 | TOPICAL_OINTMENT | Freq: Two times a day (BID) | CUTANEOUS | 2 refills | Status: AC

## 2023-09-27 MED ORDER — ESTRADIOL 0.1 MG/GM VA CREA: TOPICAL_CREAM | VAGINAL | 0 refills | Status: AC

## 2023-09-27 NOTE — Patient Instructions (Addendum)
 Start ozempic 0.25mg  when Mounjaro  is next day ( Monday)  STOP mounjaro   After 2-4 weeks on 0.25mg  ozempic, if tolerating and certainly if fasting blood sugar > 160, please increase to ozempic 0.5mg   Monitor for abdominal distention, constipation, and nausea.   Kenalog for chiggers

## 2023-09-27 NOTE — Progress Notes (Unsigned)
 Assessment & Plan:  Type 2 diabetes mellitus with hyperglycemia, without long-term current use of insulin (HCC) Assessment & Plan: Lab Results  Component Value Date   HGBA1C 6.2 (A) 09/27/2023   Excellent control. Abdominal bloating on mounjaro  5mg . Trial ozempic 0.5mg .   Orders: -     Ozempic (0.25 or 0.5 MG/DOSE); Inject 0.25 mg into the skin once a week. After 4 weeks, increase to 0.5mg  [place] qwk.  Dispense: 3 mL; Refill: 3  Elevated glucose -     POCT glycosylated hemoglobin (Hb A1C)  Vaginal atrophy -     Estradiol ; 0.5 g intravaginally 1-3 times per week.  Dispense: 42.5 g; Refill: 0  Irritant contact dermatitis, unspecified trigger -     Triamcinolone Acetonide; Apply 1 Application topically 2 (two) times daily. Use sparingly for < 1 week  Dispense: 15 g; Refill: 2  Immunization due -     Heplisav-B (HepB-CPG) Vaccine  Nonintractable headache, unspecified chronicity pattern, unspecified headache type Assessment & Plan: No alarm features at this time. Blood pressure well controlled.  Discussed skipping meals on mounjaro  and if contributory. Will monitor for now.    Hepatic steatosis Assessment & Plan: Reviewed GI consult note.  Will repeat Hep A IgM ( borderline) at follow up.  Heplisav B  1/2 given today.  Of note: staff message to Dr Jacobo, oncology, regarding elevated IgA, negative ANA. He felt clinically insignificant for multiple myeloma in absence of end organ disease. I shared with patient over mychart message.    Essential hypertension Assessment & Plan: Chronic, stable.  Continue hydrochlorothiazide  12.5 mg daily, losartan  100 mg daily      Return precautions given.   Risks, benefits, and alternatives of the medications and treatment plan prescribed today were discussed, and patient expressed understanding.   Education regarding symptom management and diagnosis given to patient on AVS either electronically or printed.  Return in about 3 months  (around 12/28/2023).  Donna Northern, FNP  Subjective:    Patient ID: [name], female    DOB: 1965-07-05, 58 y.o.   MRN: [id number]  CC: [name] is a 58 y.o. female who presents today for follow up.   HPI: Mounjaro  causes bloating.   No constipation, vomiting, farxiga .   Rybelsus  caused her to feel sick.      Complains of left sided HA  HA started this morning. She hasn't eaten yet today which can contribute She thought r/t mounajro Occassional HA for decades. HA are usually 'sinus headache'. HA today reminds her of previous HA.  Rate 4/10. She will take tylenol  or ibuprofen  with relief.   This is not worse HA of life, HA is not positional  Denies neck pain, F, chills,  vision, numbness, confusion, nausea   She didn't get the estrace  yet from pharmacy; requests to be resent   Follow up GI 11/25/23  GI consult elevated LFTs, gallstones Hep IgM positive  Due hepatitis B vaccine, shingrex  Repeat cologuard 08/2026  Elevated IgA Negative ANA  She also has chigger bites   Allergies: Patient has no known allergies. Current Outpatient Medications on File Prior to Visit  Medication Sig Dispense Refill   escitalopram  (LEXAPRO ) 10 MG tablet TAKE ONE TABLET BY MOUTH TWICE DAILY 180 tablet 1   glucose blood (ACCU-CHEK GUIDE TEST) test strip Use as instructed 100 each 12   hydrochlorothiazide  (HYDRODIURIL ) 12.5 MG tablet Take 1 tablet (12.5 mg total) by mouth daily. 90 tablet 3   losartan  (COZAAR ) 100  MG tablet TAKE ONE TABLET (100 MG) BY MOUTH EVERY DAY 90 tablet 3   ondansetron  (ZOFRAN ) 4 MG tablet Take 1 tablet (4 mg total) by mouth daily as needed for nausea or vomiting. 30 tablet 1   potassium chloride  (KLOR-CON  M) 10 MEQ tablet Take 1 tablet (10 mEq total) by mouth daily. 90 tablet 3   rosuvastatin  (CRESTOR ) 5 MG tablet Take 1 tablet (5 mg total) by mouth daily. 90 tablet 3   No current facility-administered medications on file prior to visit.    Review  of Systems  Constitutional:  Negative for chills and fever.  Eyes:  Negative for visual disturbance.  Respiratory:  Negative for cough.   Cardiovascular:  Negative for chest pain and palpitations.  Gastrointestinal:  Positive for abdominal distention. Negative for nausea and vomiting.  Neurological:  Positive for headaches.      Objective:    BP 130/70   Pulse 78   Temp 97.8 F (36.6 C) (Oral)   Ht 5' 5 (1.651 m)   Wt 224 lb 3.2 oz (101.7 kg)   LMP 02/28/2020 (Exact Date)   SpO2 97%   BMI 37.31 kg/m  BP Readings from Last 3 Encounters:  09/27/23 130/70  09/09/23 130/78  07/26/23 136/80   Wt Readings from Last 3 Encounters:  09/27/23 224 lb 3.2 oz (101.7 kg)  09/09/23 226 lb (102.5 kg)  07/26/23 226 lb 6.4 oz (102.7 kg)    Physical Exam Vitals reviewed.  Constitutional:      Appearance: She is well-developed.   Eyes:     Conjunctiva/sclera: Conjunctivae normal.    Cardiovascular:     Rate and Rhythm: Normal rate and regular rhythm.     Pulses: Normal pulses.     Heart sounds: Normal heart sounds.  Pulmonary:     Effort: Pulmonary effort is normal.     Breath sounds: Normal breath sounds. No wheezing, rhonchi or rales.   Skin:    General: Skin is warm and dry.   Neurological:     Mental Status: She is alert.   Psychiatric:        Speech: Speech normal.        Behavior: Behavior normal.        Thought Content: Thought content normal.

## 2023-09-29 ENCOUNTER — Encounter: Payer: Self-pay | Admitting: Family

## 2023-09-29 DIAGNOSIS — R519 Headache, unspecified: Secondary | ICD-10-CM | POA: Insufficient documentation

## 2023-09-29 NOTE — Assessment & Plan Note (Signed)
Chronic, stable.  Continue hydrochlorothiazide 12.5 mg daily,losartan 100 mg daily

## 2023-09-29 NOTE — Telephone Encounter (Signed)
-----   Message from Evalene JINNY Reusing sent at 09/28/2023 11:50 AM EDT ----- Regarding: RE: elevated IgA Hey there Donna.  At least from a myeloma standpoint, I think this is clinically insignificant.  She has no evidence of endorgan damage.  Regarding other related disorders, I am not 100% sure but I would guess the answer is probably the same.  Thanks!  -Tim ----- Message ----- From: Dineen Donna MATSU, FNP Sent: 09/28/2023   7:12 AM EDT To: Evalene JINNY Reusing, MD Subject: elevated IgA                                   Dr Reusing,  I would appreciate your advice in regards to an elevated IgA 06/21/23 .  Difficulty deciding significance as I ordered as a part of the celiac screen.  She is following with GI in regards to chronically elevated LFTs.  She had negative ANA.   Would this alone warrant evaluation for inflammatory disorders, vasculitis, IgA myeloma, and autoimmune diseases?    I dont want to over-refer to you.   Thank you, always  Donna

## 2023-09-29 NOTE — Assessment & Plan Note (Addendum)
 Reviewed GI consult note.  Will repeat Hep A IgM ( borderline) at follow up.  Heplisav B  1/2 given today.  Of note: staff message to Dr Jacobo, oncology, regarding elevated IgA, negative ANA. He felt clinically insignificant for multiple myeloma in absence of end organ disease. I shared with patient over mychart message.

## 2023-09-29 NOTE — Assessment & Plan Note (Signed)
 Lab Results  Component Value Date   HGBA1C 6.2 (A) 09/27/2023   Excellent control. Abdominal bloating on mounjaro  5mg . Trial ozempic 0.5mg .

## 2023-09-29 NOTE — Telephone Encounter (Signed)
Noted ?Sent mychart note to patient ?

## 2023-09-29 NOTE — Assessment & Plan Note (Addendum)
 No alarm features at this time. Blood pressure well controlled.  Discussed skipping meals on mounjaro  and if contributory. Will monitor for now.

## 2023-10-04 NOTE — Telephone Encounter (Signed)
 Called pt and let know that the pharmacy has the RX but insurance will not let her fill since she just got the Mounjaro  filled on 09/18/2023. Patient stated that she will call them back when she is on her last shot to get the Ozempic  filled.

## 2023-10-19 ENCOUNTER — Encounter: Payer: Self-pay | Admitting: Family

## 2023-10-23 ENCOUNTER — Other Ambulatory Visit: Payer: Self-pay | Admitting: Family

## 2023-10-23 MED ORDER — METFORMIN HCL ER 500 MG PO TB24: 500.0000 mg | ORAL_TABLET | Freq: Every evening | ORAL | 2 refills | Status: DC

## 2023-10-28 ENCOUNTER — Ambulatory Visit (INDEPENDENT_AMBULATORY_CARE_PROVIDER_SITE_OTHER)

## 2023-10-28 DIAGNOSIS — Z9229 Personal history of other drug therapy: Secondary | ICD-10-CM

## 2023-10-28 NOTE — Progress Notes (Signed)
 After obtaining consent, and per orders of Rollene Northern, FNP injection of Heplisav-B  2nd dose given IM in R Deltoid by Doyce Croak, CMA. Patient tolerated injection well.

## 2023-11-04 ENCOUNTER — Ambulatory Visit (INDEPENDENT_AMBULATORY_CARE_PROVIDER_SITE_OTHER)

## 2023-11-04 DIAGNOSIS — Z23 Encounter for immunization: Secondary | ICD-10-CM

## 2023-11-04 NOTE — Progress Notes (Signed)
 Pt received 2nd shingles vaccine in Left  deltoid. Pt tolerated it well with no complaints or concerns.

## 2023-11-21 ENCOUNTER — Other Ambulatory Visit: Payer: Self-pay | Admitting: Family

## 2023-11-21 DIAGNOSIS — F320 Major depressive disorder, single episode, mild: Secondary | ICD-10-CM

## 2023-11-21 MED ORDER — ESCITALOPRAM OXALATE 10 MG PO TABS: ORAL_TABLET | ORAL | 1 refills | Status: AC

## 2023-11-25 ENCOUNTER — Ambulatory Visit (INDEPENDENT_AMBULATORY_CARE_PROVIDER_SITE_OTHER): Admitting: Nurse Practitioner

## 2023-11-25 ENCOUNTER — Encounter: Payer: Self-pay | Admitting: Nurse Practitioner

## 2023-11-25 ENCOUNTER — Other Ambulatory Visit

## 2023-11-25 VITALS — BP 132/76 | HR 75 | Ht 65.0 in | Wt 225.4 lb

## 2023-11-25 DIAGNOSIS — R7989 Other specified abnormal findings of blood chemistry: Secondary | ICD-10-CM

## 2023-11-25 DIAGNOSIS — K760 Fatty (change of) liver, not elsewhere classified: Secondary | ICD-10-CM

## 2023-11-25 LAB — HEPATIC FUNCTION PANEL
ALT: 51 U/L — ABNORMAL HIGH (ref 0–35)
AST: 54 U/L — ABNORMAL HIGH (ref 0–37)
Albumin: 3.7 g/dL (ref 3.5–5.2)
Alkaline Phosphatase: 104 U/L (ref 39–117)
Bilirubin, Direct: 0.2 mg/dL (ref 0.0–0.3)
Total Bilirubin: 0.8 mg/dL (ref 0.2–1.2)
Total Protein: 6.9 g/dL (ref 6.0–8.3)

## 2023-11-25 NOTE — Patient Instructions (Addendum)
 Your provider has requested that you go to the basement level for lab work before leaving today. Press B on the elevator. The lab is located at the first door on the left as you exit the elevator.  You have been scheduled for an abdominal ultrasound at [facility] radiology on 11/30/23 at 9:30 am. Please arrive 30 minutes prior to your appointment for registration. Make certain not to have anything to eat or drink 6 hours prior to your appointment. Should you need to reschedule your appointment, please contact radiology at [phone]. This test typically takes about 30 minutes to perform.    BENEFIBER ONE TABLESPOON ONCE DAILY   IMODIUM 1/2 TO 1 TAB DAILY AS NEEDED TO PREVENT URGENT BOWEL MOVEMENTS   EXERCISE AS TOLERATED, LOSE WEIGHT   _______________________________________________________  If your blood pressure at your visit was 140/90 or greater, please contact your primary care physician to follow up on this.  _______________________________________________________  If you are age 60 or older, your body mass index should be between 23-30. Your Body mass index is 37.5 kg/m. If this is out of the aforementioned range listed, please consider follow up with your Primary Care Provider.  If you are age 52 or younger, your body mass index should be between 19-25. Your Body mass index is 37.5 kg/m. If this is out of the aformentioned range listed, please consider follow up with your Primary Care Provider.   ________________________________________________________  The [facility] providers would like to encourage you to use MYCHART to communicate with providers for non-urgent requests or questions.  Due to long hold times on the telephone, sending your provider a message by [facility] may be a faster and more efficient way to get a response.  Please allow 48 business hours for a response.  Please remember that this is for non-urgent requests.   _______________________________________________________  [facility] is using a team-based approach to care.  Your team is made up of your doctor and two to three APPS. Our APPS (Nurse Practitioners and Physician Assistants) work with your physician to ensure care continuity for you. They are fully qualified to address your health concerns and develop a treatment plan. They communicate directly with your gastroenterologist to care for you. Seeing the Advanced Practice Practitioners on your physician's team can help you by facilitating care more promptly, often allowing for earlier appointments, access to diagnostic testing, procedures, and other specialty referrals.

## 2023-11-25 NOTE — Progress Notes (Signed)
 11/25/2023 RYE DORADO [id number] 18-Nov-1965   Chief Complaint: Gallstones, fatty liver   History of Present Illness: [name] is a 58 year old female with a past medical history of depression, hypertension, diabetes mellitus type 2 and hepatic steatosis. Past partial hysterectomy.  I initially saw patient in office 09/09/2023 for further evaluation regarding elevated LFTs and hepatic steatosis. At that time, she was instructed to reduce alcohol intake, reduce carbohydrates in the diet, exercise and lose weight to reduce the risk of developing fatty liver related disease, advance liver disease. She presents today for further follow up.   Labs 09/09/2023: AST 42. ALT 39. Hep B core total antibody level was ordered but was not done Labs 08/19/2023: Hep A total antibody nonreactive. Hep A IgM borderline reactive.  Labs 07/06/2023: Hep B surface antibody < 5. Hep A IgM borderline reactive.  Labs 06/21/2023:  Hepatitis A IgM reactive. Hep A total antibody positive. Hepatitis B surface antigen nonreactive.  Hepatitis B core IgM nonreactive. Hepatitis C antibody nonreactive. ANA negative. SMA < 20. AMA < 20. Ceruloplasmin 24. IgA 424. TTG IgG < 1. Gliadin IgG < 1. Gliadin IgA < 1. CK 15. IgG 1,381. Iron 100.  Ferritin 108.  INR 1.1.  Cholesterol 174.  LDL 115.  HDL 38.50.  Labs 06/02/2023: Total bilirubin level of 0.6.  Alk phos 123.  AST 41.  ALT 54.  A RUQ sonogram 06/10/2023 showed cholelithiasis without evidence of acute cholecystitis or biliary duct dilatation and showed evidence of hepatic steatosis.   Since her office visit 6/6, she has reduced alcohol intake and reduced carbohydrate intake. Drinks one or two glasses of wine on the weekends. No weight loss. She was prescribed Ozempic  by her PCP, she took one dose then stopped it because it made her feel sick. She drinks one glass of sweet tea daily. She reported receiving hepatitis B vaccination per her PCP.  She has intermittent urgent bowel  movements, sometimes soils self. No  blood or black stools. No specific food or stress triggers. She was recently started on Metformin  which might be contributing to her altered bowel pattern.   Cologuard test 08/26/2023 which was negative.  No known family history of colon polyps or colorectal cancer.      Latest Ref Rng & Units 09/09/2023   12:21 PM 08/19/2023    9:27 AM 06/21/2023    9:43 AM  Hepatic Function  Total Protein 6.0 - 8.3 g/dL 6.8  6.9    Albumin 3.5 - 5.2 g/dL 3.7  3.8    AST 0 - 37 U/L 42  47    ALT 0 - 35 U/L 39  50    Alk Phosphatase 39 - 117 U/L 99  107  157   Total Bilirubin 0.2 - 1.2 mg/dL 0.5  0.6    Bilirubin, Direct 0.0 - 0.3 mg/dL 0.1  0.1         Latest Ref Rng & Units 06/02/2023    2:51 PM 04/01/2020    3:29 AM 03/31/2020   10:14 AM  CBC  WBC 4.0 - 10.5 K/uL 7.7  12.1  8.5   Hemoglobin 12.0 - 15.0 g/dL 85.0  88.4  86.9   Hematocrit 36.0 - 46.0 % 44.5  35.8  39.8   Platelets 150.0 - 400.0 K/uL 169.0 Repeated and verified X2.  303  247     Cologuard 08/26/2023: Negative   RUQ sonogram 06/23/2023:   Gallbladder: Large stone in the gallbladder lumen. No gallbladder  wall thickening or pericholecystic fluid. Negative sonographic Murphy's sign.   Common bile duct: Diameter: 5 mm   Liver: Increased echogenicity. No focal lesion. Portal vein is patent on color Doppler imaging with normal direction of blood flow towards the liver.   Other: None.   IMPRESSION: 1. Cholelithiasis without secondary signs of acute cholecystitis. 2. Increased hepatic parenchymal echogenicity suggestive of steatosis.   Current Outpatient Medications on File Prior to Visit  Medication Sig Dispense Refill   escitalopram  (LEXAPRO ) 10 MG tablet TAKE ONE TABLET BY MOUTH TWICE DAILY 180 tablet 1   estradiol  (ESTRACE ) 0.1 MG/GM vaginal cream 0.5 g intravaginally 1-3 times per week. 42.5 g 0   glucose blood (ACCU-CHEK GUIDE TEST) test strip Use as instructed 100 each 12    hydrochlorothiazide  (HYDRODIURIL ) 12.5 MG tablet Take 1 tablet (12.5 mg total) by mouth daily. 90 tablet 3   losartan  (COZAAR ) 100 MG tablet TAKE ONE TABLET (100 MG) BY MOUTH EVERY DAY 90 tablet 3   metFORMIN  (GLUCOPHAGE -XR) 500 MG 24 hr tablet Take 1 tablet (500 mg total) by mouth every evening. 90 tablet 2   ondansetron  (ZOFRAN ) 4 MG tablet Take 1 tablet (4 mg total) by mouth daily as needed for nausea or vomiting. 30 tablet 1   potassium chloride  (KLOR-CON  M) 10 MEQ tablet Take 1 tablet (10 mEq total) by mouth daily. 90 tablet 3   rosuvastatin  (CRESTOR ) 5 MG tablet Take 1 tablet (5 mg total) by mouth daily. 90 tablet 3   triamcinolone  ointment (KENALOG ) 0.5 % Apply 1 Application topically 2 (two) times daily. Use sparingly for < 1 week 15 g 2   No current facility-administered medications on file prior to visit.   No Known Allergies  Current Medications, Allergies, Past Medical History, Past Surgical History, Family History and Social History were reviewed in [employer] record.  Review of Systems:   Constitutional: Negative for fever, sweats, chills or weight loss.  Respiratory: Negative for shortness of breath.   Cardiovascular: Negative for chest pain, palpitations and leg swelling.  Gastrointestinal: See HPI.  Musculoskeletal: Negative for back pain or muscle aches.  Neurological: Negative for dizziness, headaches or paresthesias.   Physical Exam: Ht 5' 5 (1.651 m)   Wt 225 lb 6 oz (102.2 kg)   LMP 02/28/2020 (Exact Date)   BMI 37.50 kg/m  Wt Readings from Last 3 Encounters:  11/25/23 225 lb 6 oz (102.2 kg)  09/27/23 224 lb 3.2 oz (101.7 kg)  09/09/23 226 lb (102.5 kg)    General: 58 year old female in no acute distress. Head: Normocephalic and atraumatic. Eyes: No scleral icterus. Conjunctiva pink . Ears: Normal auditory acuity. Mouth: Dentition intact. No ulcers or lesions.  Lungs: Clear throughout to auscultation. Heart: Regular rate and  rhythm, no murmur. Abdomen: Soft, nontender and nondistended. No masses or hepatomegaly. Normal bowel sounds x 4 quadrants.  Rectal: Deferred.  Musculoskeletal: Symmetrical with no gross deformities. Extremities: No edema. Neurological: Alert oriented x 4. No focal deficits.  Psychological: Alert and cooperative. Normal mood and affect  Assessment and Recommendations:  58 year old female with chronically elevated LFTs since 2013. RUQ sonogram showed evidence of gallstones without biliary ductal dilatation and hepatic steatosis. Positive Hep A IgM and positive Hep A total antibody 06/2023, repeat Hep A total antibody nonreactive 08/19/2023. Hep B surface antigen negative and Hep B surface antibody < 5. Received Hep B vaccination per PCP. Hep C antibody negative. Autoimmune liver disease markers normal/negative.  - Patient encouraged to  reduce carbohydrates in diet i.e.: Reduce bread/pasta/potatoes/rice and sweets, exercise as tolerated and lose weight to reduce the risk of developing fatty liver disease - Reduce alcohol intake, I discussed with the patient in setting of routine alcohol use + hepatic steatosis she is at increased risk for liver disease  - Schedule abdominal sonogram with elastography - Hepatic panel  - Repeat Hep A total antibody in 2 to 3 months to check for antibody response d/t positive Hep A IgM and positive Hep A total antibody 5 months ago then negative Hep A total antibody level 3 months ago. If not immune will need Hep A vaccination  - Okay to continue Crestor  5 mg daily for now  Altered bowel pattern, occasional urgent bowel movements with episodes of fecal incontinence, suspect Metformin  a contributing factor. No specific food triggers.  -Patient to monitor, record diet intake with next episodes of urgent BMs/fecal incontinence -Benefiber 1 tablespoon daily -Imodium 1/2 to 1 tab po daily as needed, stop if no BM in 24 hours -Consider a diagnostic colonoscopy if symptoms  persist/worsen -May need to switch Metformin  to alternative diabetes medication if symptoms driven by this med   Colon cancer screening.  Negative Cologuard 08/2023.  No family history of colon polyps or colorectal cancer. - Recommend a repeat Cologuard 08/2026 versus a conventional colonoscopy   DM type II

## 2023-11-28 ENCOUNTER — Ambulatory Visit: Payer: Self-pay | Admitting: Nurse Practitioner

## 2023-11-30 ENCOUNTER — Ambulatory Visit

## 2023-12-04 NOTE — Progress Notes (Signed)
 Agree with the assessment and plan as outlined by Elida Shawl, NP.  Would also consider elastography.  If F2-3 fibrosis, patient may benefit from GLP-1 or Rezdiffra   Era Parr E. Stacia, MD [facility]

## 2023-12-05 NOTE — Progress Notes (Signed)
 Dr. Stacia, [name], an abd sonogram with elastography was ordered at the time of patient's office visit.  I will forward results to you once received.

## 2023-12-09 ENCOUNTER — Ambulatory Visit
Admission: RE | Admit: 2023-12-09 | Discharge: 2023-12-09 | Disposition: A | Discharge status: Home / Self Care | Source: Ambulatory Visit | Attending: Nurse Practitioner | Admitting: Nurse Practitioner

## 2023-12-09 DIAGNOSIS — K760 Fatty (change of) liver, not elsewhere classified: Secondary | ICD-10-CM | POA: Insufficient documentation

## 2023-12-09 DIAGNOSIS — R7989 Other specified abnormal findings of blood chemistry: Secondary | ICD-10-CM | POA: Insufficient documentation

## 2023-12-30 ENCOUNTER — Encounter: Payer: Self-pay | Admitting: Family

## 2023-12-30 ENCOUNTER — Ambulatory Visit: Admitting: Family

## 2023-12-30 ENCOUNTER — Telehealth: Payer: Self-pay | Admitting: Family

## 2023-12-30 VITALS — BP 128/78 | HR 76 | Temp 98.3°F | Ht 65.0 in | Wt 223.0 lb

## 2023-12-30 DIAGNOSIS — E1165 Type 2 diabetes mellitus with hyperglycemia: Secondary | ICD-10-CM

## 2023-12-30 DIAGNOSIS — K760 Fatty (change of) liver, not elsewhere classified: Secondary | ICD-10-CM | POA: Diagnosis not present

## 2023-12-30 DIAGNOSIS — Z7984 Long term (current) use of oral hypoglycemic drugs: Secondary | ICD-10-CM

## 2023-12-30 LAB — POCT GLYCOSYLATED HEMOGLOBIN (HGB A1C): Hemoglobin A1C: 10.3 % — AB (ref 4.0–5.6)

## 2023-12-30 MED ORDER — TIRZEPATIDE 2.5 MG/0.5ML ~~LOC~~ SOAJ: 2.5000 mg | SUBCUTANEOUS | 2 refills | Status: DC

## 2023-12-30 NOTE — Progress Notes (Signed)
 Assessment & Plan:  Type 2 diabetes mellitus with hyperglycemia, without long-term current use of insulin (HCC) Assessment & Plan: Severely uncontrolled. Start mounjaro  2.5mg . increase metformin  500mg  BID.  She will keep blood glucose log.Close follow up in 2 weeks to review glucose.  Due to family history of CVA and patient risk factors, ordered baseline ultrasound carotid.  Orders: -     Tirzepatide ; Inject 2.5 mg into the skin once a week.  Dispense: 2 mL; Refill: 2 -     US  Carotid Bilateral; Future -     POCT glycosylated hemoglobin (Hb A1C) -     Hepatitis A antibody, total -     Hepatitis A antibody, IgM  Hepatic steatosis Assessment & Plan: Chronic, stable.  Following gastroenterology.  Discussed weight loss and the use of Mounjaro  to thwart progression of hepatic steatosis.      Return precautions given.   Risks, benefits, and alternatives of the medications and treatment plan prescribed today were discussed, and patient expressed understanding.   Education regarding symptom management and diagnosis given to patient on AVS either electronically or printed.  Return in about 2 weeks (around 01/13/2024).  Rollene Northern, FNP  Subjective:    Patient ID: [name], female    DOB: February 06, 1966, 58 y.o.   MRN: [id number]  CC: [name] is a 58 y.o. female who presents today for follow up.   HPI: HPI Discussed the use of AI scribe software for clinical note transcription with the patient, who gave verbal consent to proceed.  History of Present Illness   [name] is a 58 year old female with type 2 diabetes who presents for a follow-up regarding medication management.  She has experienced difficulty with the medication Ozempic , which caused GI discomfort. She was previously on Mounjaro , initially tolerated well, but faced issues due to medication shortages, resulting in inconsistent use. She had been on Mounjaro  5 mg. She is considering resuming Mounjaro  at a lower  dose for better tolerance.  She is reducing alcohol and carbohydrates.  She has a history of hepatic steatosis and elevated liver enzymes. No personal history of thyroid cancer or medullary thyroid cancer or MEN in the family.  No personal history of pancreatitis.                Allergies: Patient has no known allergies. Current Outpatient Medications on File Prior to Visit  Medication Sig Dispense Refill   escitalopram  (LEXAPRO ) 10 MG tablet TAKE ONE TABLET BY MOUTH TWICE DAILY 180 tablet 1   estradiol  (ESTRACE ) 0.1 MG/GM vaginal cream 0.5 g intravaginally 1-3 times per week. 42.5 g 0   glucose blood (ACCU-CHEK GUIDE TEST) test strip Use as instructed 100 each 12   hydrochlorothiazide  (HYDRODIURIL ) 12.5 MG tablet Take 1 tablet (12.5 mg total) by mouth daily. 90 tablet 3   losartan  (COZAAR ) 100 MG tablet TAKE ONE TABLET (100 MG) BY MOUTH EVERY DAY 90 tablet 3   ondansetron  (ZOFRAN ) 4 MG tablet Take 1 tablet (4 mg total) by mouth daily as needed for nausea or vomiting. 30 tablet 1   potassium chloride  (KLOR-CON  M) 10 MEQ tablet Take 1 tablet (10 mEq total) by mouth daily. 90 tablet 3   rosuvastatin  (CRESTOR ) 5 MG tablet Take 1 tablet (5 mg total) by mouth daily. 90 tablet 3   triamcinolone  ointment (KENALOG ) 0.5 % Apply 1 Application topically 2 (two) times daily. Use sparingly for < 1 week 15 g 2   metFORMIN  (GLUCOPHAGE -XR)  500 MG 24 hr tablet Take 1 tablet (500 mg total) by mouth every evening. (Patient not taking: Reported on 12/30/2023) 90 tablet 2   No current facility-administered medications on file prior to visit.    Review of Systems  Constitutional:  Negative for chills and fever.  Respiratory:  Negative for cough.   Cardiovascular:  Negative for chest pain and palpitations.  Gastrointestinal:  Negative for nausea and vomiting.      Objective:    BP 128/78   Pulse 76   Temp 98.3 F (36.8 C) (Oral)   Ht 5' 5 (1.651 m)   Wt 223 lb (101.2 kg)   LMP 02/28/2020  (Exact Date)   SpO2 98%   BMI 37.11 kg/m  BP Readings from Last 3 Encounters:  12/30/23 128/78  11/25/23 132/76  09/27/23 130/70   Wt Readings from Last 3 Encounters:  12/30/23 223 lb (101.2 kg)  11/25/23 225 lb 6 oz (102.2 kg)  09/27/23 224 lb 3.2 oz (101.7 kg)   Lab Results  Component Value Date   HGBA1C 10.3 (A) 12/30/2023    Physical Exam Vitals reviewed.  Constitutional:      Appearance: She is well-developed.  Eyes:     Conjunctiva/sclera: Conjunctivae normal.  Cardiovascular:     Rate and Rhythm: Normal rate and regular rhythm.     Pulses: Normal pulses.     Heart sounds: Normal heart sounds.  Pulmonary:     Effort: Pulmonary effort is normal.     Breath sounds: Normal breath sounds. No wheezing, rhonchi or rales.  Skin:    General: Skin is warm and dry.  Neurological:     Mental Status: She is alert.  Psychiatric:        Speech: Speech normal.        Behavior: Behavior normal.        Thought Content: Thought content normal.

## 2023-12-30 NOTE — Telephone Encounter (Signed)
 Call radiology ultrasound  Large stone in the gallbladder lumen in ruq US  06/10/2023  In recent US  ruq 12/09/23, no evidence  Can radiology do an overread to ensure no gallstone present anymore?

## 2023-12-30 NOTE — Assessment & Plan Note (Addendum)
 Severely uncontrolled. Start mounjaro  2.5mg . increase metformin  500mg  BID.  She will keep blood glucose log.Close follow up in 2 weeks to review glucose.  Due to family history of CVA and patient risk factors, ordered baseline ultrasound carotid.

## 2023-12-30 NOTE — Patient Instructions (Addendum)
 Retrial mounjaro  2.5mg  x 8 weeks  Increase metformin  500mg  twice daily.  Goal of fasting blood sugar is between 70-120. If in this range, we are reaching our target a1c ( goal 6.5%)   Please check fasting blood sugar in the morning time DAILY FOR NOW.  You may also check if you feel like you are having a low episode or particularly high episode of blood sugar.  If blood sugars increase, I may advise you to check blood sugar after your largest meal.  You specifically do this TWO hours after largest meal with the goal of being less than 180.  If blood sugar is checked sooner than 2 hours after largest meal, and it will be  expected to be elevated. You must wait 2 hours.   If your blood sugar is less than 180 hours after your largest meal, again we are reaching our target a1c goal   Call [facility] clinic if: BG < 70 or > 300.   If you have any symptoms of low blood sugar ( sweating, shakiness, lightheaded, dizzy) that you notify me. If you have a low, please drink a glass of orange juice and recheck blood sugar every 5 minutes until you don't feel symptomatic AND blood sugar is above 80.

## 2023-12-31 LAB — HEPATITIS A ANTIBODY, TOTAL: Hepatitis A AB,Total: REACTIVE — AB

## 2023-12-31 LAB — HEPATITIS A ANTIBODY, IGM: Hep A IgM: NONREACTIVE

## 2024-01-02 ENCOUNTER — Ambulatory Visit: Payer: Self-pay | Admitting: Family

## 2024-01-02 NOTE — Telephone Encounter (Signed)
 Called radiology for an update report I gave the receptionist the fax number to our office someone from the office will fax over the information

## 2024-01-04 NOTE — Assessment & Plan Note (Signed)
 Chronic, stable.  Following gastroenterology.  Discussed weight loss and the use of Mounjaro  to thwart progression of hepatic steatosis.

## 2024-01-09 NOTE — Telephone Encounter (Signed)
 Spoke to [facility] radiology and she states that Dr who performed US  stated that they did not see any large stone in recent US  she will fax over results toi us , fax number was given

## 2024-01-10 NOTE — Telephone Encounter (Signed)
 See pt calls spoke to them on 01/09/24 waiting on fax from them

## 2024-01-11 NOTE — Telephone Encounter (Signed)
 Called pt with the results she is aware. Mailing her results, transferred it up front to be mailed off

## 2024-01-12 NOTE — Telephone Encounter (Signed)
 See mychart note from 01/11/24 Pt aware

## 2024-01-18 ENCOUNTER — Telehealth: Payer: Self-pay

## 2024-01-18 MED ORDER — METFORMIN HCL ER 500 MG PO TB24
500.0000 mg | ORAL_TABLET | Freq: Two times a day (BID) | ORAL | 2 refills | Status: AC
Start: 2024-01-18 — End: ?

## 2024-01-18 NOTE — Telephone Encounter (Signed)
 Copied from CRM [id number]. Topic: Clinical - Medication Question >> Jan 18, 2024  9:10 AM Vena HERO wrote: Reason for CRM: Gwendolynn from [facility] is calling because pt states that her provider was sending a change of direction for her medication Metformin  ER 500mg ,  stating it should be 2 a day. Pharmacy states they have not received this change. Please call pharmacy at [phone] and fax [phone]

## 2024-01-18 NOTE — Telephone Encounter (Signed)
 Call pt I sent in metformin  500mg  BID

## 2024-01-18 NOTE — Addendum Note (Signed)
 Addended by: Auria Mckinlay G on: 01/18/2024 11:14 AM   Modules accepted: Orders

## 2024-01-18 NOTE — Telephone Encounter (Signed)
 Pt has been informed of rx sent in

## 2024-01-18 NOTE — Telephone Encounter (Signed)
 LVM informing pt that we sent in new updated rx for Metformin 

## 2024-02-13 ENCOUNTER — Encounter: Payer: Self-pay | Admitting: Family

## 2024-02-13 DIAGNOSIS — E1165 Type 2 diabetes mellitus with hyperglycemia: Secondary | ICD-10-CM

## 2024-02-13 MED ORDER — TIRZEPATIDE 5 MG/0.5ML ~~LOC~~ SOAJ: 5.0000 mg | SUBCUTANEOUS | 0 refills | Status: DC

## 2024-03-09 ENCOUNTER — Other Ambulatory Visit: Payer: Self-pay | Admitting: Family Medicine

## 2024-03-09 DIAGNOSIS — I10 Essential (primary) hypertension: Secondary | ICD-10-CM

## 2024-03-12 ENCOUNTER — Other Ambulatory Visit: Payer: Self-pay | Admitting: Family

## 2024-03-12 DIAGNOSIS — I10 Essential (primary) hypertension: Secondary | ICD-10-CM

## 2024-03-16 ENCOUNTER — Other Ambulatory Visit: Payer: Self-pay | Admitting: Family

## 2024-03-16 DIAGNOSIS — I10 Essential (primary) hypertension: Secondary | ICD-10-CM

## 2024-03-20 ENCOUNTER — Telehealth: Payer: Self-pay

## 2024-03-20 NOTE — Telephone Encounter (Signed)
 Copied from CRM #[id number]. Topic: Clinical - Prescription Issue >> Mar 20, 2024  2:35 PM China J wrote: Reason for CRM: The patient would like to know if Rollene could start refilling her losartan  since she is no longer being seen by the ordering physician who originally prescribed this medication. She would like a phone call at [phone] for an update as she is completley out of medication.

## 2024-03-21 MED ORDER — LOSARTAN POTASSIUM 100 MG PO TABS: ORAL_TABLET | ORAL | 3 refills | Status: AC

## 2024-03-21 NOTE — Telephone Encounter (Unsigned)
 Copied from CRM [id number]. Topic: Clinical - Medication Question >> Mar 21, 2024  3:14 PM Alfonso ORN wrote: Reason for CRM: pt called back . Relayed message. Pt states she is only taking the   Losartan  100 mg, and hydrochlorothiazide  12.5 and not the amlodipine.

## 2024-03-21 NOTE — Telephone Encounter (Signed)
 Noted

## 2024-03-21 NOTE — Addendum Note (Signed)
 Addended by: Neshawn Aird G on: 03/21/2024 08:18 AM   Modules accepted: Orders

## 2024-03-21 NOTE — Telephone Encounter (Signed)
 LVM to call back to office to clarify which BP medication is she taking Losartan  100 mg, and hydrochlorothiazide  12.5. Also Is she taking Amlodopine , because it is not on her med list?

## 2024-04-12 ENCOUNTER — Encounter: Payer: Self-pay | Admitting: Family

## 2024-04-13 ENCOUNTER — Other Ambulatory Visit: Payer: Self-pay | Admitting: Family

## 2024-04-13 DIAGNOSIS — E1165 Type 2 diabetes mellitus with hyperglycemia: Secondary | ICD-10-CM

## 2024-04-13 MED ORDER — TIRZEPATIDE 7.5 MG/0.5ML ~~LOC~~ SOAJ: 7.5000 mg | SUBCUTANEOUS | 2 refills | Status: AC
